# Patient Record
Sex: Female | Born: 1960 | Race: Black or African American | Hispanic: No | Marital: Married | Smoking: Never smoker
Health system: Southern US, Community
[De-identification: ages and names within clinical notes are randomized; demographics above are authoritative.]

## PROBLEM LIST (undated history)

## (undated) DIAGNOSIS — E1149 Type 2 diabetes mellitus with other diabetic neurological complication: Secondary | ICD-10-CM

## (undated) DIAGNOSIS — I10 Essential (primary) hypertension: Secondary | ICD-10-CM

## (undated) DIAGNOSIS — K219 Gastro-esophageal reflux disease without esophagitis: Secondary | ICD-10-CM

## (undated) DIAGNOSIS — M1990 Unspecified osteoarthritis, unspecified site: Secondary | ICD-10-CM

## (undated) DIAGNOSIS — R079 Chest pain, unspecified: Secondary | ICD-10-CM

## (undated) DIAGNOSIS — F32A Depression, unspecified: Secondary | ICD-10-CM

## (undated) DIAGNOSIS — E785 Hyperlipidemia, unspecified: Secondary | ICD-10-CM

## (undated) DIAGNOSIS — F329 Major depressive disorder, single episode, unspecified: Secondary | ICD-10-CM

## (undated) DIAGNOSIS — E119 Type 2 diabetes mellitus without complications: Secondary | ICD-10-CM

## (undated) HISTORY — DX: Unspecified osteoarthritis, unspecified site: M19.90

## (undated) HISTORY — DX: Type 2 diabetes mellitus with other diabetic neurological complication: E11.49

## (undated) HISTORY — PX: SALPINGOOPHORECTOMY: SHX82

## (undated) HISTORY — DX: Depression, unspecified: F32.A

## (undated) HISTORY — PX: ABDOMINAL HYSTERECTOMY: SHX81

## (undated) HISTORY — DX: Major depressive disorder, single episode, unspecified: F32.9

## (undated) HISTORY — DX: Gastro-esophageal reflux disease without esophagitis: K21.9

## (undated) HISTORY — DX: Hyperlipidemia, unspecified: E78.5

## (undated) HISTORY — DX: Chest pain, unspecified: R07.9

## (undated) HISTORY — DX: Essential (primary) hypertension: I10

## (undated) HISTORY — DX: Type 2 diabetes mellitus without complications: E11.9

## (undated) HISTORY — PX: CHOLECYSTECTOMY: SHX55

---

## 1998-11-22 ENCOUNTER — Other Ambulatory Visit: Admission: RE | Admit: 1998-11-22 | Discharge: 1998-11-22 | Payer: Self-pay | Admitting: Obstetrics & Gynecology

## 2000-08-21 ENCOUNTER — Encounter (INDEPENDENT_AMBULATORY_CARE_PROVIDER_SITE_OTHER): Payer: Self-pay | Admitting: Specialist

## 2000-08-21 ENCOUNTER — Encounter (HOSPITAL_BASED_OUTPATIENT_CLINIC_OR_DEPARTMENT_OTHER): Payer: Self-pay | Admitting: General Surgery

## 2000-08-21 ENCOUNTER — Encounter: Payer: Self-pay | Admitting: Internal Medicine

## 2000-08-21 ENCOUNTER — Inpatient Hospital Stay (HOSPITAL_COMMUNITY): Admission: EM | Admit: 2000-08-21 | Discharge: 2000-08-22 | Payer: Self-pay | Admitting: Internal Medicine

## 2000-11-25 ENCOUNTER — Other Ambulatory Visit: Admission: RE | Admit: 2000-11-25 | Discharge: 2000-11-25 | Payer: Self-pay | Admitting: Obstetrics and Gynecology

## 2004-05-15 ENCOUNTER — Emergency Department (HOSPITAL_COMMUNITY): Admission: EM | Admit: 2004-05-15 | Discharge: 2004-05-15 | Payer: Self-pay | Admitting: Emergency Medicine

## 2004-08-29 ENCOUNTER — Emergency Department (HOSPITAL_COMMUNITY): Admission: EM | Admit: 2004-08-29 | Discharge: 2004-08-29 | Payer: Self-pay | Admitting: Emergency Medicine

## 2004-09-05 ENCOUNTER — Ambulatory Visit (HOSPITAL_COMMUNITY): Admission: RE | Admit: 2004-09-05 | Discharge: 2004-09-05 | Payer: Self-pay | Admitting: Obstetrics and Gynecology

## 2004-09-18 ENCOUNTER — Ambulatory Visit: Admission: RE | Admit: 2004-09-18 | Discharge: 2004-09-18 | Payer: Self-pay | Admitting: Gynecologic Oncology

## 2004-09-29 ENCOUNTER — Emergency Department (HOSPITAL_COMMUNITY): Admission: EM | Admit: 2004-09-29 | Discharge: 2004-09-29 | Payer: Self-pay | Admitting: Emergency Medicine

## 2004-10-09 ENCOUNTER — Inpatient Hospital Stay (HOSPITAL_COMMUNITY): Admission: AD | Admit: 2004-10-09 | Discharge: 2004-10-13 | Payer: Self-pay | Admitting: Obstetrics and Gynecology

## 2004-10-09 ENCOUNTER — Encounter: Payer: Self-pay | Admitting: Emergency Medicine

## 2004-10-11 ENCOUNTER — Encounter (INDEPENDENT_AMBULATORY_CARE_PROVIDER_SITE_OTHER): Payer: Self-pay | Admitting: Specialist

## 2005-05-28 ENCOUNTER — Ambulatory Visit: Payer: Self-pay | Admitting: Internal Medicine

## 2005-06-07 ENCOUNTER — Ambulatory Visit: Payer: Self-pay | Admitting: Internal Medicine

## 2005-11-06 ENCOUNTER — Other Ambulatory Visit: Admission: RE | Admit: 2005-11-06 | Discharge: 2005-11-06 | Payer: Self-pay | Admitting: Obstetrics and Gynecology

## 2006-12-22 ENCOUNTER — Ambulatory Visit: Payer: Self-pay | Admitting: Internal Medicine

## 2007-01-16 ENCOUNTER — Ambulatory Visit: Payer: Self-pay | Admitting: Internal Medicine

## 2007-01-16 ENCOUNTER — Observation Stay (HOSPITAL_COMMUNITY): Admission: EM | Admit: 2007-01-16 | Discharge: 2007-01-17 | Payer: Self-pay | Admitting: Emergency Medicine

## 2007-01-17 ENCOUNTER — Ambulatory Visit: Payer: Self-pay | Admitting: Internal Medicine

## 2007-01-28 ENCOUNTER — Ambulatory Visit: Payer: Self-pay | Admitting: Internal Medicine

## 2007-03-09 ENCOUNTER — Ambulatory Visit: Payer: Self-pay | Admitting: Internal Medicine

## 2007-03-09 LAB — CONVERTED CEMR LAB
ALT: 17 units/L (ref 0–40)
AST: 12 units/L (ref 0–37)
BUN: 13 mg/dL (ref 6–23)
CO2: 29 meq/L (ref 19–32)
Calcium: 9.2 mg/dL (ref 8.4–10.5)
Chloride: 109 meq/L (ref 96–112)
Cholesterol: 157 mg/dL (ref 0–200)
Creatinine, Ser: 0.6 mg/dL (ref 0.4–1.2)
GFR calc Af Amer: 139 mL/min
GFR calc non Af Amer: 115 mL/min
Glucose, Bld: 111 mg/dL — ABNORMAL HIGH (ref 70–99)
HDL: 40.1 mg/dL (ref >39.0)
LDL Cholesterol: 93 mg/dL (ref 0–99)
Potassium: 3.8 meq/L (ref 3.5–5.1)
Sodium: 143 meq/L (ref 135–145)
Total CHOL/HDL Ratio: 3.9
Triglycerides: 118 mg/dL (ref 0–149)
VLDL: 24 mg/dL (ref 0–40)

## 2007-03-17 ENCOUNTER — Ambulatory Visit: Payer: Self-pay | Admitting: Internal Medicine

## 2007-06-09 ENCOUNTER — Ambulatory Visit: Payer: Self-pay | Admitting: Internal Medicine

## 2007-06-09 LAB — CONVERTED CEMR LAB
ALT: 20 units/L (ref 0–40)
AST: 15 units/L (ref 0–37)
Albumin: 3.5 g/dL (ref 3.5–5.2)
Alkaline Phosphatase: 77 units/L (ref 39–117)
BUN: 11 mg/dL (ref 6–23)
Basophils Absolute: 0 10*3/uL (ref 0.0–0.1)
Basophils Relative: 0 % (ref 0.0–1.0)
Bilirubin Urine: NEGATIVE
Bilirubin, Direct: 0.1 mg/dL (ref 0.0–0.3)
CO2: 29 meq/L (ref 19–32)
Calcium: 9.9 mg/dL (ref 8.4–10.5)
Chloride: 105 meq/L (ref 96–112)
Cholesterol: 196 mg/dL (ref 0–200)
Creatinine, Ser: 0.8 mg/dL (ref 0.4–1.2)
Eosinophils Absolute: 0.2 10*3/uL (ref 0.0–0.6)
Eosinophils Relative: 2.5 % (ref 0.0–5.0)
GFR calc Af Amer: 100 mL/min
GFR calc non Af Amer: 82 mL/min
Glucose, Bld: 113 mg/dL — ABNORMAL HIGH (ref 70–99)
HCT: 36 % (ref 36.0–46.0)
HDL: 50.6 mg/dL (ref >39.0)
Hemoglobin, Urine: NEGATIVE
Hemoglobin: 12 g/dL (ref 12.0–15.0)
Ketones, ur: NEGATIVE mg/dL
LDL Cholesterol: 130 mg/dL — ABNORMAL HIGH (ref 0–99)
Leukocytes, UA: NEGATIVE
Lymphocytes Relative: 30.7 % (ref 12.0–46.0)
MCHC: 33.3 g/dL (ref 30.0–36.0)
MCV: 84.8 fL (ref 78.0–100.0)
Monocytes Absolute: 0.5 10*3/uL (ref 0.2–0.7)
Monocytes Relative: 7.4 % (ref 3.0–11.0)
Neutro Abs: 4.1 10*3/uL (ref 1.4–7.7)
Neutrophils Relative %: 59.4 % (ref 43.0–77.0)
Nitrite: NEGATIVE
Platelets: 336 10*3/uL (ref 150–400)
Potassium: 4.4 meq/L (ref 3.5–5.1)
RBC: 4.25 M/uL (ref 3.87–5.11)
RDW: 14.3 % (ref 11.5–14.6)
Sodium: 143 meq/L (ref 135–145)
Specific Gravity, Urine: 1.025 (ref 1.000–1.03)
TSH: 1.71 microintl units/mL (ref 0.35–5.50)
Total Bilirubin: 0.5 mg/dL (ref 0.3–1.2)
Total CHOL/HDL Ratio: 3.9
Total Protein, Urine: NEGATIVE mg/dL
Total Protein: 7 g/dL (ref 6.0–8.3)
Triglycerides: 75 mg/dL (ref 0–149)
Urine Glucose: NEGATIVE mg/dL
Urobilinogen, UA: 0.2 (ref 0.0–1.0)
VLDL: 15 mg/dL (ref 0–40)
WBC: 6.9 10*3/uL (ref 4.5–10.5)
pH: 6 (ref 5.0–8.0)

## 2007-06-16 ENCOUNTER — Ambulatory Visit: Payer: Self-pay | Admitting: Internal Medicine

## 2007-10-26 ENCOUNTER — Encounter: Payer: Self-pay | Admitting: Internal Medicine

## 2007-11-27 ENCOUNTER — Encounter: Payer: Self-pay | Admitting: Internal Medicine

## 2008-02-20 ENCOUNTER — Encounter: Payer: Self-pay | Admitting: *Deleted

## 2008-02-20 DIAGNOSIS — Z9089 Acquired absence of other organs: Secondary | ICD-10-CM | POA: Insufficient documentation

## 2008-02-20 DIAGNOSIS — E785 Hyperlipidemia, unspecified: Secondary | ICD-10-CM | POA: Insufficient documentation

## 2008-02-20 DIAGNOSIS — I10 Essential (primary) hypertension: Secondary | ICD-10-CM | POA: Insufficient documentation

## 2008-02-20 DIAGNOSIS — E119 Type 2 diabetes mellitus without complications: Secondary | ICD-10-CM | POA: Insufficient documentation

## 2008-03-16 LAB — CONVERTED CEMR LAB

## 2008-03-22 ENCOUNTER — Telehealth: Payer: Self-pay | Admitting: Internal Medicine

## 2008-06-14 ENCOUNTER — Ambulatory Visit: Payer: Self-pay | Admitting: Internal Medicine

## 2008-06-14 LAB — CONVERTED CEMR LAB
ALT: 18 units/L (ref 0–35)
AST: 12 units/L (ref 0–37)
Albumin: 3.5 g/dL (ref 3.5–5.2)
Alkaline Phosphatase: 83 units/L (ref 39–117)
BUN: 15 mg/dL (ref 6–23)
Basophils Absolute: 0.1 10*3/uL (ref 0.0–0.1)
Basophils Relative: 0.7 % (ref 0.0–1.0)
Bilirubin Urine: NEGATIVE
Bilirubin, Direct: 0.1 mg/dL (ref 0.0–0.3)
CO2: 29 meq/L (ref 19–32)
Calcium: 9.3 mg/dL (ref 8.4–10.5)
Chloride: 104 meq/L (ref 96–112)
Cholesterol: 162 mg/dL (ref 0–200)
Creatinine, Ser: 0.7 mg/dL (ref 0.4–1.2)
Eosinophils Absolute: 0.1 10*3/uL (ref 0.0–0.7)
Eosinophils Relative: 1.8 % (ref 0.0–5.0)
GFR calc Af Amer: 116 mL/min
GFR calc non Af Amer: 96 mL/min
Glucose, Bld: 126 mg/dL — ABNORMAL HIGH (ref 70–99)
HCT: 35.8 % — ABNORMAL LOW (ref 36.0–46.0)
HDL: 47.8 mg/dL (ref >39.0)
Hemoglobin, Urine: NEGATIVE
Hemoglobin: 12.2 g/dL (ref 12.0–15.0)
Ketones, ur: NEGATIVE mg/dL
LDL Cholesterol: 97 mg/dL (ref 0–99)
Leukocytes, UA: NEGATIVE
Lymphocytes Relative: 30.5 % (ref 12.0–46.0)
MCHC: 34.1 g/dL (ref 30.0–36.0)
MCV: 85.2 fL (ref 78.0–100.0)
Monocytes Absolute: 0.6 10*3/uL (ref 0.1–1.0)
Monocytes Relative: 8.9 % (ref 3.0–12.0)
Neutro Abs: 4.2 10*3/uL (ref 1.4–7.7)
Neutrophils Relative %: 58.1 % (ref 43.0–77.0)
Nitrite: NEGATIVE
Platelets: 338 10*3/uL (ref 150–400)
Potassium: 4.1 meq/L (ref 3.5–5.1)
RBC: 4.2 M/uL (ref 3.87–5.11)
RDW: 14.1 % (ref 11.5–14.6)
Sodium: 139 meq/L (ref 135–145)
Specific Gravity, Urine: 1.01 (ref 1.000–1.03)
TSH: 2.62 microintl units/mL (ref 0.35–5.50)
Total Bilirubin: 0.6 mg/dL (ref 0.3–1.2)
Total CHOL/HDL Ratio: 3.4
Total Protein, Urine: NEGATIVE mg/dL
Total Protein: 6.8 g/dL (ref 6.0–8.3)
Triglycerides: 88 mg/dL (ref 0–149)
Urine Glucose: NEGATIVE mg/dL
Urobilinogen, UA: 0.2 (ref 0.0–1.0)
VLDL: 18 mg/dL (ref 0–40)
WBC: 7.2 10*3/uL (ref 4.5–10.5)
pH: 7.5 (ref 5.0–8.0)

## 2008-06-20 ENCOUNTER — Ambulatory Visit: Payer: Self-pay | Admitting: Internal Medicine

## 2008-06-20 DIAGNOSIS — E1149 Type 2 diabetes mellitus with other diabetic neurological complication: Secondary | ICD-10-CM

## 2008-06-28 ENCOUNTER — Encounter: Payer: Self-pay | Admitting: Internal Medicine

## 2008-10-20 ENCOUNTER — Ambulatory Visit: Payer: Self-pay | Admitting: Internal Medicine

## 2008-10-20 ENCOUNTER — Telehealth: Payer: Self-pay | Admitting: Internal Medicine

## 2008-10-20 DIAGNOSIS — R079 Chest pain, unspecified: Secondary | ICD-10-CM

## 2008-11-11 ENCOUNTER — Ambulatory Visit: Payer: Self-pay

## 2008-11-11 ENCOUNTER — Encounter: Payer: Self-pay | Admitting: Cardiology

## 2008-11-26 ENCOUNTER — Encounter: Payer: Self-pay | Admitting: Internal Medicine

## 2008-12-01 ENCOUNTER — Telehealth: Payer: Self-pay | Admitting: Internal Medicine

## 2008-12-12 ENCOUNTER — Encounter: Payer: Self-pay | Admitting: Internal Medicine

## 2008-12-29 ENCOUNTER — Telehealth: Payer: Self-pay | Admitting: Internal Medicine

## 2009-01-02 ENCOUNTER — Telehealth: Payer: Self-pay | Admitting: Internal Medicine

## 2009-01-02 ENCOUNTER — Ambulatory Visit: Payer: Self-pay | Admitting: Internal Medicine

## 2009-03-07 ENCOUNTER — Telehealth: Payer: Self-pay | Admitting: Internal Medicine

## 2009-03-07 ENCOUNTER — Ambulatory Visit: Payer: Self-pay | Admitting: Internal Medicine

## 2009-03-17 ENCOUNTER — Encounter: Payer: Self-pay | Admitting: Internal Medicine

## 2009-09-04 ENCOUNTER — Telehealth: Payer: Self-pay | Admitting: Internal Medicine

## 2009-10-20 ENCOUNTER — Ambulatory Visit: Payer: Self-pay | Admitting: Internal Medicine

## 2009-10-20 LAB — CONVERTED CEMR LAB
ALT: 16 units/L (ref 0–35)
AST: 15 units/L (ref 0–37)
Albumin: 3.7 g/dL (ref 3.5–5.2)
Alkaline Phosphatase: 102 units/L (ref 39–117)
BUN: 11 mg/dL (ref 6–23)
Basophils Absolute: 0 10*3/uL (ref 0.0–0.1)
Basophils Relative: 0.1 % (ref 0.0–3.0)
Bilirubin Urine: NEGATIVE
Bilirubin, Direct: 0.1 mg/dL (ref 0.0–0.3)
CO2: 29 meq/L (ref 19–32)
Calcium: 9.5 mg/dL (ref 8.4–10.5)
Chloride: 105 meq/L (ref 96–112)
Cholesterol: 145 mg/dL (ref 0–200)
Creatinine, Ser: 0.7 mg/dL (ref 0.4–1.2)
Eosinophils Absolute: 0.2 10*3/uL (ref 0.0–0.7)
Eosinophils Relative: 2.7 % (ref 0.0–5.0)
GFR calc non Af Amer: 114.73 mL/min (ref >60)
Glucose, Bld: 100 mg/dL — ABNORMAL HIGH (ref 70–99)
HCT: 37.7 % (ref 36.0–46.0)
HDL: 44.6 mg/dL (ref >39.00)
Hemoglobin, Urine: NEGATIVE
Hemoglobin: 12.2 g/dL (ref 12.0–15.0)
Ketones, ur: NEGATIVE mg/dL
LDL Cholesterol: 85 mg/dL (ref 0–99)
Leukocytes, UA: NEGATIVE
Lymphocytes Relative: 36.8 % (ref 12.0–46.0)
Lymphs Abs: 2.4 10*3/uL (ref 0.7–4.0)
MCHC: 32.5 g/dL (ref 30.0–36.0)
MCV: 86.2 fL (ref 78.0–100.0)
Monocytes Absolute: 0.5 10*3/uL (ref 0.1–1.0)
Monocytes Relative: 7.7 % (ref 3.0–12.0)
Neutro Abs: 3.3 10*3/uL (ref 1.4–7.7)
Neutrophils Relative %: 52.7 % (ref 43.0–77.0)
Nitrite: NEGATIVE
Platelets: 309 10*3/uL (ref 150.0–400.0)
Potassium: 4.3 meq/L (ref 3.5–5.1)
RBC: 4.37 M/uL (ref 3.87–5.11)
RDW: 14.2 % (ref 11.5–14.6)
Sodium: 141 meq/L (ref 135–145)
Specific Gravity, Urine: 1.01 (ref 1.000–1.030)
TSH: 1.58 microintl units/mL (ref 0.35–5.50)
Total Bilirubin: 0.5 mg/dL (ref 0.3–1.2)
Total CHOL/HDL Ratio: 3
Total Protein, Urine: NEGATIVE mg/dL
Total Protein: 7.3 g/dL (ref 6.0–8.3)
Triglycerides: 77 mg/dL (ref 0.0–149.0)
Urine Glucose: NEGATIVE mg/dL
Urobilinogen, UA: 0.2 (ref 0.0–1.0)
VLDL: 15.4 mg/dL (ref 0.0–40.0)
WBC: 6.4 10*3/uL (ref 4.5–10.5)
pH: 7 (ref 5.0–8.0)

## 2009-11-01 ENCOUNTER — Telehealth: Payer: Self-pay | Admitting: Internal Medicine

## 2009-12-28 ENCOUNTER — Encounter: Payer: Self-pay | Admitting: Internal Medicine

## 2009-12-28 ENCOUNTER — Telehealth: Payer: Self-pay | Admitting: Internal Medicine

## 2010-01-01 ENCOUNTER — Encounter: Payer: Self-pay | Admitting: Internal Medicine

## 2010-08-29 ENCOUNTER — Telehealth (INDEPENDENT_AMBULATORY_CARE_PROVIDER_SITE_OTHER): Payer: Self-pay | Admitting: *Deleted

## 2010-09-14 ENCOUNTER — Telehealth: Payer: Self-pay | Admitting: Internal Medicine

## 2011-01-15 NOTE — Progress Notes (Signed)
Summary: METFORMIN ?  Phone Note From Pharmacy   Summary of Call: Pharm is req metformin ER 1 two times a day but EMR has pt taking med one once daily. Please advise.  Initial call taken by: Lamar Sprinkles, CMA,  August 29, 2010 7:06 PM  Follow-up for Phone Call        reviewed records: patient was takng metformin ER 500mg  once daily No note or record to show a chnage in dose.  Lst OV No '11. Will need OV Oct or Nov. Will need labs to include A1C 250.00 Follow-up by: Jacques Navy MD,  August 30, 2010 1:00 PM  Additional Follow-up for Phone Call Additional follow up Details #1::        Rx sent in, please help schedule pt for cpx w/labs prior also include A1c 250.00. THANK U Additional Follow-up by: Lamar Sprinkles, CMA,  August 30, 2010 4:48 PM    Additional Follow-up for Phone Call Additional follow up Details #2::    Pt contacted, pt states she will call back asap to make appt as stated above Follow-up by: Verdell Face,  September 20, 2010 12:15 PM  Prescriptions: GLUCOPHAGE XR 500 MG  TB24 (METFORMIN HCL) Take 1 tablet once daily  #90 x 0   Entered by:   Lamar Sprinkles, CMA   Authorized by:   Jacques Navy MD   Signed by:   Lamar Sprinkles, CMA on 08/30/2010   Method used:   Electronically to        [facility] [address] (retail)       [phone]       [place], [place]  [address]       Ph: [phone]       Fax: [phone]   RxID:   [id number]

## 2011-01-15 NOTE — Progress Notes (Signed)
Summary: Prilosec/Omeprazole pa  Phone Note Call from Patient   Call For: [facility] [phone] Summary of Call: PA request--Prilosec 20mg . They will fax form. Initial call taken by: Lucious Groves,  December 28, 2009 9:37 AM  Follow-up for Phone Call        Form rec'd and awaiting signature--on counter beside MD printer. Follow-up by: Lucious Groves,  December 28, 2009 9:59 AM  Additional Follow-up for Phone Call Additional follow up Details #1::        form was signed by MD and faxed. Additional Follow-up by: Lucious Groves,  December 28, 2009 10:43 AM    Additional Follow-up for Phone Call Additional follow up Details #2::    I have not rec'd an approval/denial. Called [facility], and prescription has been approved until 12/28/2010. Follow-up by: Lucious Groves,  January 01, 2010 4:14 PM

## 2011-01-15 NOTE — Medication Information (Signed)
Summary: P Auth Prilosec/[facility]  P Auth Prilosec/[facility]   Imported By: Lester [place] 01/03/2010 08:15:07  _____________________________________________________________________  External Attachment:    Type:   Image     Comment:   External Document

## 2011-01-15 NOTE — Medication Information (Signed)
Summary: Elissa Hefty   Imported By: Sherian Rein 01/01/2010 13:48:35  _____________________________________________________________________  External Attachment:    Type:   Image     Comment:   External Document

## 2011-01-15 NOTE — Progress Notes (Signed)
  Phone Note Refill Request Message from:  Fax from Pharmacy on September 14, 2010 2:17 PM  Refills Requested: Medication #1:  GABAPENTIN 300 MG  CAPS 1 by mouth three times a day for leg pain fax from pharm. Please Advise refill  Initial call taken by: Ami Bullins CMA,  September 14, 2010 2:17 PM  Follow-up for Phone Call        ok for refill x 5 Follow-up by: Jacques Navy MD,  September 14, 2010 4:00 PM    Prescriptions: GABAPENTIN 300 MG  CAPS (GABAPENTIN) 1 by mouth three times a day for leg pain  #90 Each x 5   Entered by:   Bill Salinas CMA   Authorized by:   Jacques Navy MD   Signed by:   Bill Salinas CMA on 09/14/2010   Method used:   Electronically to        [facility] [address] (retail)       [phone]       [place], [place]  [address]       Ph: [phone]       Fax: [phone]   RxID:   [id number]

## 2011-04-30 NOTE — Assessment & Plan Note (Signed)
[facility]                           PRIMARY CARE OFFICE NOTE   NAME:PASCHALMisty, Washington                       MRN:          [id number]  DATE:06/16/2007                            DOB:          02/19/1961    [name] Washington is a 50 year old African-American woman who presents for  followup evaluation and exam.  She was last seen in the office March 17, 2007 for diabetes, hyperlipidemia.  In the interval the patient reports  she is having problem with dysesthesia in an area of her left thigh on  the lateral aspect.  This is a site of varicose veins.  The patient  reports this is a long standing problem but it is much worse now and  exacerbated by walking.   The patient reports that her diabetes has been well controlled with CBGs  in the 75 to 120 range.  The patient reports she has been monitoring her  blood pressure at home and it has generally been well controlled.   PAST MEDICAL HISTORY, FAMILY HISTORY, SOCIAL HISTORY:  Well documented  in my note January 16, 2007, as is family history and social history.  This was a note by Dr. Thomos Lemons.   CURRENT MEDICATIONS:  1. Quinaretic 20/25 one daily.  2. Aspirin 325 mg daily.  3. Calcium.  4. Metformin 500 mg b.i.d.  5. Pravastatin 40 mg daily.   REVIEW OF SYSTEMS:  The patient has had no fevers, sweats or chills or  other constitutional problems.  She has an eye exam scheduled for August  2008.  No ENT, cardiovascular, respiratory, GI or GU problems.   SOCIAL HISTORY:  Is unchanged.   EXAMINATION:  Temperature was 97.7, blood pressure 144/89 and pulse 75.  Weight 218.  GENERAL APPEARANCE:  This is an obese African-American woman in no acute  distress.  HEENT:  Normocephalic.  Atraumatic.  EACs and TMs were unremarkable.  Oropharynx with native dentition in good repair.  No buccal or palate  lesions were noted.  Posterior pharynx was clear.  Conjunctivae and  sclerae were clear.  Pupils equal, round  and reactive to light and  accommodation.  Funduscopic exam was unremarkable.  NECK:  Supple without thyromegaly.  NODES:  No adenopathy was noted in the cervical, supraclavicular  regions.  CHEST:  No CVA tenderness.  LUNGS:  Clear to auscultation and percussion.  CARDIOVASCULAR:  2+ radial pulse, no JVD, no carotid bruits.  She had a  quiet precordium with a regular rate and rhythm, without murmurs, rubs  or gallops.  BREAST:  Exam deferred to gynecology.  ABDOMEN:  Obese, no guarding or rebound.  PELVIC AND RECTAL:  Exam was deferred to gynecology.  EXTREMITIES:  Without clubbing, cyanosis, edema, no deformities were  noted.   LABORATORY:  Hemoglobin 12 g, white count was 6,900 with a normal  differential,  chemistries with a serum glucose of 113.  Liver functions  were normal.  Kidney function normal with a creatinine of 0.8 and GFR of  100 milliliters per minute.  Cholesterol is 196, triglycerides 75, HDL  is 50.6,  LDL is 130.  Thyroid function normal with a TSH of 1.71,  urinalysis was cloudy but negative otherwise.   ASSESSMENT AND PLAN:  1. Diabetes.  The patient's last hemoglobin A1C was 7.3% in February      2008.  Her serum glucose is now 113.  She reports her CBGs are      evidently well controlled.  Plan is to continue present medication.  2. Hypertension.  Well controlled.  3. Lipids.  The patient's previous LDL cholesterol was 93, now up to      130, which I suspect is dietary in nature.  In addition she is on      pravastatin which is on the low end of efficacy.  Plan:  The      patient is to switch to simvastatin 40 mg daily. She is to be more      careful with her diet in regards to fat.  Regular exercise would be      helpful.  4. Paresthesia.  The patient has area of paresthesia in her left      thigh, which I suspect is neuropathic.  I doubt it is related to      the varicosity.  At this point patient is given a trial of Lyrica      50 mg daily for 3 days, 50  mg b.i.d. for 3 days, then 50 mg t.i.d.      The patient will report back to me as to the efficacy of the      Lyrica.  5. Health maintenance.  The patient is followed by Dr. Su Hilt of GYN.      She is status post hysterectomy and unilateral oophorectomy.  I      have recommended that she see the gynecologist for routine      evaluation and exam, including breast exam.   SUMMARY:  This is a pleasant woman with medical problems as outlined  above.  She will endeavor to be more adherent to a low fat diet and we  will try to find an exercise method she can tolerate.     [name] Gess Norins, MD  Electronically Signed    MEN/MedQ  DD: 06/17/2007  DT: 06/17/2007  Job #: [id number]   cc:   [name] Washington

## 2011-05-03 NOTE — H&P (Signed)
[name] Washington, SKILLIN                ACCOUNT NO.:  [id number]   MEDICAL RECORD NO.:  [id number]          PATIENT TYPE:  INP   LOCATION:  9319                          FACILITY:  [facility]   PHYSICIAN:  Hal Morales, M.D.DATE OF BIRTH:  10-17-1961   DATE OF ADMISSION:  10/09/2004  DATE OF DISCHARGE:                                HISTORY & PHYSICAL   HISTORY OF PRESENT ILLNESS:  The patient is a 50 year old, para 0-1-0-1 who  presents for evaluation of abdominal pelvic pain and a large pelvic mass.  The history of the present illness began approximately six weeks ago when  the patient had her September menses.  Subsequent to her menses, she  experienced significant abdominal and pelvic pain and presented to the  [facility] Emergency Room. She underwent an abdominopelvic CT scan as part  of her emergency room workup on August 29, 2004 showing the abdomen to be  status post cholecystectomy with no active disease in the abdomen and the  pelvis to contain a large cystic mass associated with the right ovary  measuring approximately 9.2 x 7.2 cm.  There was likewise a smaller cystic  mass associated with the right ovary with some calcification along one of  the margins. She also underwent GC and chlamydia cultures which were  negative. A urinalysis which was negative and RPR which was negative.  She  was seen the following day and underwent an abdominopelvic ultrasound which  showed the complex right adnexal cystic mass measuring approximately 10 cm.  It should be noted that on the CT scan no abnormal lymph nodes were seen in  the pelvis at all.  Subsequent to her evaluation and ultrasound, she was  seen by De Blanch, M.D. on September 18, 2004 with his evaluation  including a CA 125 of 29.5.  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 She was likewise noted to have fibroids which  were  relatively asymptomatic. At the time, the patient was no longer having  significant pain and opted to delay the recommended exploratory laparotomy  until she was on holiday from school.  The patient was thus scheduled for  exploratory laparotomy on October 18, 2004.   The patient presented to the [facility] Emergency Department on the evening  of October 08, 2004 complaining of worsening of her pain. She states that  she has had pain off and on since her last menstrual period which began on  October 26, 2004.  She has been treated with oral pain medication but has  really gotten minimal relief.  The pain has been persistent and exacerbated  to the point that on October 24, she presented to the emergency department  where she underwent an acute abdomen and chest series showing a normal bowel  gas pattern and no active cardiovascular disease.  The patient presented to  our office on October 09, 2004 complaining that she had gotten no relief  from her pain after her visit to the emergency department rating  her pain as  an 8-10/10.  She also complained of nausea with minimal vomiting and  constipation.  She also states that she has had some difficulty with voiding  and that she voids only small amounts.   PAST MEDICAL HISTORY:  1.  Hypertension.  2.  Exogenous obesity.   PAST SURGICAL HISTORY:  1.  Cholecystectomy.  2.  Tubal ligation.   OBSTETRICAL HISTORY:  Spontaneous vaginal delivery of a preterm infant in  58.   CURRENT MEDICATIONS:  1.  Accuretic 25/20, 1 p.o. q.d.  2.  Calcium.  3.  Multivitamins.   FAMILY HISTORY:  No history of ovarian, breast or colon cancer.   SOCIAL HISTORY:  The patient is married and works as a Surveyor, mining.  She does not smoke or drink alcohol.   REVIEW OF SYMPTOMS:  Positive for report of fever at home but temperature  was not actually taken and the patient does not remember when her last fever  was.  She also complains of the  above nausea with minimal vomiting and has  had no vomiting during her observation in the emergency room.  The remainder  of the review of systems is negative except as mentioned above.   PHYSICAL EXAMINATION:  GENERAL:  The patient is an obese black female in  moderate distress from abdominopelvic pain.  VITAL SIGNS:  The temperature is 93, pulse 90, respirations 20, blood  pressure 140/80.  LUNGS:  Clear.  HEART:  Regular rate and rhythm.  ABDOMEN:  Obese which limits the abdominal examination.  There is tenderness  to deep palpation especially in the right lower quadrant.  There is no  rebound tenderness.  PELVIC:  Performed by Osborn Coho, M.D. in our office and revealed  external genitalia, Bartholin's, urethra and Skene's normal. Vagina normal.  The cervix was without any lesions and there was no cervical motion  tenderness. On bimanual examination, the exam was compromised by the  patient's body habitus and pain but no organs could be palpated. There was  noted to be no stool in the rectum on rectovaginal examination.   LABORATORY DATA:  CBC and chemistries are pending. The ultrasound by verbal  report shows a 10 cm cyst with a question of bleeding into the cyst. There  was flow noted through the pedicle. There was some free fluid in the  peritoneal cavity with the differential being hemorrhage into the neoplastic  cyst and possible torsion though the flow through the pedicle seemed to  mitigate against this.   IMPRESSION:  1.  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  2.  Insidious onset abdominopelvic pain probably secondary to hemorrhage      into the cyst.  3.  Uterine fibroids relatively asymptomatic.  4.  Exogenous obesity.  5.  Chronic hypertension.   DISPOSITION:  The patient is admitted for pain control and further observation as she is prepared for  surgery. The goal will be to perform a  bowel prep to allow the patient to be maximally prepared for her surgical  procedure.  This will be begun this evening. She will likewise be hydrated.  She will be reassessed in the morning after having been n.p.o. overnight to  determine whether she needs to undergo urgent laparotomy on October 26  otherwise her surgery is scheduled for October 11, 2004.  This plan has been  discussed with the patient and her husband and they concur.     Colin Ina   VPH/MEDQ  D:  10/09/2004  T:  10/09/2004  Job:  [id number]

## 2011-05-03 NOTE — Discharge Summary (Signed)
NAMESHAWNA, WEARING                ACCOUNT NO.:  [id number]   MEDICAL RECORD NO.:  [id number]          PATIENT TYPE:  INP   LOCATION:  9319                          FACILITY:  [facility]   PHYSICIAN:  Osborn Coho, M.D.   DATE OF BIRTH:  [birth year]   DATE OF ADMISSION:  10/09/2004  DATE OF DISCHARGE:  10/13/2004                                 DISCHARGE SUMMARY   ADDENDUM   ADMISSION PHYSICAL EXAMINATION:  VITAL SIGNS:  Blood pressure ranging from  132 to 140 over 82 to 94, pulse was 90, respirations 20, temperature 98.3  degrees Fahrenheit orally.  GENERAL:  Within normal limits.  However, her abdominal exam revealed  distention.  (During the patient's preoperative evaluation she was found to  have tenderness in the right lower quadrant to deep palpation; however,  there was no rebound tenderness.)  PELVIC:  (Taken from preoperative evaluation) EG/BUS was within normal  limits.  The vagina was normal.  Cervix was without any lesions and there  was no cervical motion tenderness.  On bimanual exam, the exam was  compromised by the patient's body habitus as well as her pain, but no organs  could be palpated.  It was also noted that there was no stool in the rectum  on rectovaginal examination.     Elmi   EJP/MEDQ  D:  10/31/2004  T:  10/31/2004  Job:  [id number]

## 2011-05-03 NOTE — Op Note (Signed)
[facility]  Patient:    [name] Washington, [name] Washington                         MRN: [id number] Proc. Date: 08/21/00 Adm. Date:  [id number] Attending:  Fortino Sic CC:         Titus Dubin. Alwyn Ren, M.D. [facility]   Operative Report  PREOPERATIVE DIAGNOSIS:  Symptomatic cholelithiasis.  POSTOPERATIVE DIAGNOSIS:  Symptomatic cholelithiasis.  OPERATION PERFORMED:  Laparoscopic cholecystectomy with intraoperative cholangiogram.  SURGEON:  Marnee Spring. Wiliam Ke, M.D.  ASSISTANT:  Mardene Celeste. Lurene Shadow, M.D.  ANESTHESIA:  Endotracheal by hospital.  DESCRIPTION OF PROCEDURE:  Under good endotracheal anesthesia, the skin of the abdomen was prepped and draped in the usual manner.  A transverse incision was made above the umbilicus.  The abdomen was entered.  A pursestring suture was placed.  The Hassan cannula was inserted and pneumoperitoneum was obtained. The peritoneal surfaces appeared normal save for the gallbladder which was clearly inflamed and swollen with many adhesions.  A 10 mm and two 5 mm cannulas were inserted in the usual fashion.  The adhesions were taken down. Dissecting near the porta hepatis, the cystic artery was identified, triply clipped, and cut with ________.  The cystic duct was identified and it was clipped on the gallbladder side.  The cystic duct was opened and a Reddick catheter was inserted.  Cholangiogram was performed using the fluoroscopic technique.  This showed no dilatation, but did show no flow into the duodenum. Glucagon was given intravenously and there is still no flow into the duodenum.  At this point, the cystic duct was triply clipped and cut using two clips proximally.  The gallbladder was removed from the liver bed with electrocautery current and hemostasis was obtained with electrocautery current.  The gallbladder was removed via the umbilical port in an Endopouch. The gallbladder was full of hundreds of thousands of stones and it was necessary  to enlarge the umbilical wound somewhat.  The right upper quadrant was copiously irrigated with saline and sucked dry.  The umbilical wound was closed with figure-of-eight suture of 0 Vicryl.  The wound was copiously irrigated with saline and then all the cannulas removed.  The wounds were closed with subcuticular 4-0 Vicryl and Steri-Strips applied.  Estimated blood loss minimal.  The patient received no blood and left the operating room in satisfactory condition after sponge and needle counts were verified. DD:  08/21/00 TD:  08/22/00 Job: [id number]

## 2011-05-03 NOTE — Consult Note (Signed)
[facility]  Patient:    [name] Washington, [name] Washington                         MRN: [id number] Proc. Date: 08/21/00 Adm. Date:  [id number] Attending:  Fortino Sic CC:         Marnee Spring. Wiliam Ke, M.D.             Titus Dubin. Alwyn Ren, M.D. [facility]                          Consultation Report  REFERRED BY:  Dr. Marnee Spring. Wiliam Ke, M.D.  REASON FOR CONSULTATION:  Possible common duct stone.  HISTORY:  45 black female who presented to the emergency room with severe back and right upper quadrant pain, nausea and vomiting.  Ultrasound showed multiple gallstones; no dilated ducts.  Patients labs were remarkable for a completely normal liver test, total bilirubin and white count was normal.  She had a laparoscopic cholecystectomy by Dr. Wiliam Ke today; gallbladder was removed.  The common duct was filled and did not show any clear-cut stones but it appeared that there may be a stone on one picture, the second picture showed this to fill out, but the contrast did not come into the duodenum.  Due to the lack of drainage, we were asked to see her to see whether or not we felt an ERCP was necessary.  MEDICATIONS ON ADMISSION 1. Diltia XT 180 mg q.d. 2. Tylenol ES.  ALLERGIES:  She is allergic to PENICILLIN.  MEDICAL HISTORY:  Hypertension.  No other chronic medical problems.  PAST SURGICAL HISTORY:  Only surgery is a previous tubal ligation.  FAMILY HISTORY:  Noncontributory.  PHYSICAL EXAMINATION  VITAL SIGNS:  Patient is afebrile.  Vital signs are normal.  GENERAL:  Somewhat groggy black female who is alert.  She is still somewhat sleepy after her anesthesia.  HEENT:  Eyes:  Sclerae nonicteric.  LUNGS:  Clear.  HEART:  Regular rate and rhythm, without murmurs or gallops.  ABDOMEN:  Soft and generally nontender other than normal postoperative tenderness.  There are no bowel sounds present.  ASSESSMENT:  Delayed emptying of common bile duct -- there is no clear stone in  the duct.  I will not subject this woman to an endoscopic retrograde cholangiopancreatogram since I would question whether this is due to anesthesia versus common duct stone.  PLAN:  Will check LFTs in the a.m. to make the decision at that time on whether or not to proceed with an ERCP.  DD:  08/21/00 TD:  08/21/00 Job: [id number]

## 2011-05-03 NOTE — Assessment & Plan Note (Signed)
[facility]                           PRIMARY CARE OFFICE NOTE   NAME:[name], [name] Washington                       MRN:          [id number]  DATE:01/16/2007                            DOB:          [birth year]    CHIEF COMPLAINT:  Chest pain.   HISTORY OF PRESENT ILLNESS:   HISTORY OF PRESENT ILLNESS:  The patient is a 50 year old African-  American female with past medical history of hypertension, who presents  with 24-48 hours of chest discomfort.  She states last night she had a  drawing sensation.  She points to the center of her chest, that occurred  nonexertional.  She was sleeping.  She notes discomfort that waxes and  wanes.  She does not characterize it as burning.  She has had increased  intensity of  symptoms over the last 3 week time period.  In addition,  she has not been able to afford her blood pressure medications for at  least 1-2 weeks due to cost.   She denies any associated shortness of breath or diaphoresis. Chest pain  unrelieved with Zantac or over-the-counter antacids.   PAST MEDICAL HISTORY/SUMMARY:  1. Hypertension with poor compliance.  2. History of abnormal glucose 1 year ago, never following up her      blood testing.  3. Possible dyslipidemia.  4. Morbid obesity.  5. Status post laparoscopic cholecystectomy in 2001.  6. Status post abdominal hysterectomy with right salpingo-oophorectomy      in 2005.   CURRENT MEDICATIONS:  1. Quinaretic 20/25, 1 daily.  2. Aspirin 325 mg once daily.  3. Multivitamin 1 daily.   ALLERGIES TO MEDICATIONS:  PENICILLIN WHICH CAUSES SWELLING.   HABITS:  No tobacco, occasional alcohol.   FAMILY HISTORY:  Father had heart disease was status post bypass  surgery.  Both parents had hypertension.   SOCIAL HISTORY:  The patient is married, accompanied by her husband.  She is a Engineer, agricultural, normally drives a school bus.  She does  have a 33-year-old son.   REVIEW OF SYSTEMS:  As  noted above.  No HEENT symptoms.  The patient  denies any lower extremity swelling, frequent heartburn, no dysuria.  All other systems negative.   EKG was performed in the office which showed normal sinus rhythm at 66  beats per minute.  She had T-wave abnormality in the inferolateral  leads.  Compared to previous EKG, she does have a small Q wave in II,  III, and aVF that was not apparent on EKG in July 2001.   PHYSICAL EXAMINATION:  VITAL SIGNS:  Height is not obtained.  Weight is  235 pounds.  Temperature is 98.  Pulse is 73.  BP is 174/95 in the left  arm in seated position.  GENERAL:  The patient is a pleasant morbidly obese African-American  female in no apparent distress.  HEENT:  Normocephalic, atraumatic.  Pupils are equal and reactive to  light bilaterally.  Extraocular motility was intact.  The patient was  anicteric.  Conjunctivae was within normal limits. TM's clear  bilaterally.  Oropharynx was unremarkable.  NECK:  Thick but no adenopathy or thyromegaly.  The patient did have  increased pigmentation and thickening of the skin posterior over neck,  possibly acanthosis nigricans.  CHEST:  Normal respiratory effort.  Chest was clear to auscultation  bilaterally.  No rhonchi, rales, or wheezing.  CARDIOVASCULAR:  Regular rate, rhythm.  No significant murmurs, rubs, or  gallops appreciated.  No palpable chest wall tenderness.  ABDOMEN:  Protuberant/obese, had mild epigastric discomfort.  No  organomegaly.  MUSCULOSKELETAL:  No cyanosis, clubbing, or edema.  SKIN:  Warm and dry.  NEUROLOGIC:  Cranial nerves 2-12 grossly intact.  She was nonfocal.   IMPRESSION:  1. Atypical chest pain in a 50 -year-old African-American female with      multiple risk factors and abnormal EKG.  2. Hypertension, uncontrolled.  3. History of abnormal glucose, probable type 2 diabetes.  4. Gastroesophageal reflux disease   RECOMMENDATIONS:  Despite atypical symptomatology, the patient does  have  abnormal EKG, and we will admit for a cardiac rule out.  We will cycle  cardiac enzymes, check a D-dimer and chest x-ray.  I will restart her  Quinaretic, and we discussed the importance of taking her blood pressure  medications on a regular basis.  In addition, we will start Lopressor at  low dose 25 mg 1/2 p.o. b.i.d.  Her aspirin will be continued.   If she rules out with negative cardiac enzymes, we discussed obtaining  stress test as an outpatient.  She was started on Protonix at 40 mg p.o.  b.i.d.     Barbette Hair. Artist Pais, DO  Electronically Signed   RDY/MedQ  DD: 01/16/2007  DT: 01/16/2007  Job #: [id number]   cc:   Rosalyn Gess. Norins, MD

## 2011-05-03 NOTE — H&P (Signed)
[facility]  Patient:    [name] Washington, [name] Washington                         MRN: [id number] Adm. Date:  [id number] Attending:  Fortino Sic CC:         Titus Dubin. Alwyn Ren, M.D. [facility]   History and Physical  CHIEF COMPLAINT:  Abdominal pain.  HISTORY OF PRESENT ILLNESS:  This is a 50 year old, gravida 1, para 1, last menstrual period seven days ago, awakened with the very sudden onset of severe upper midline back pain associated with severe nausea and vomiting, epigastric and right upper quadrant pain and tenderness.  She came to the emergency room and received a Toradol shot and is somewhat relieved.  The patient was still somewhat nauseated and still somewhat tender.  There have been no fevers, chills, or sweating spells, no cough, chest pain, or pleurisy.  PAST MEDICAL HISTORY:  She has had hypertension, under treatment.  PAST SURGICAL HISTORY:  Tubal ligation.  SOCIAL HISTORY:  Cigarettes:  None.  Alcohol occasionally.  MEDICATIONS:  High blood pressure pill.  ALLERGIES:  PENICILLIN.  REVIEW OF SYSTEMS:  Otherwise negative.  FAMILY HISTORY:  Father has had triple bypass.  Mother also has heart disease.  PHYSICAL EXAMINATION:  VITAL SIGNS:  Temperature 97.5, pulse 80, respirations 18, blood pressure 160/100.  GENERAL:  Obese, in mild abdominal distress.  HEENT:  Normocephalic.  Eyes, ears, nose, and throat normal.  NECK:  Supple.  CHEST:  Clear.  HEART:  Regular rhythm without thrills, murmurs, or gallops.  ABDOMEN:  Obese, slightly tender right upper quadrant.  PELVIC, RECTAL:  Deferred.  EXTREMITIES:  Within normal limits.  NEUROLOGIC:  Within normal limits.  SKIN:  Within normal limits.  ADMITTING IMPRESSION:  Symptomatic cholelithiasis. DD:  08/21/00 TD:  08/21/00 Job: [id number]

## 2011-05-03 NOTE — Discharge Summary (Signed)
[name] Washington, [name] Washington NO.:  [id number]   MEDICAL RECORD NO.:  [id number]          PATIENT TYPE:  INP   LOCATION:  4733                         FACILITY:  [facility]   PHYSICIAN:  Corwin Levins, MD      DATE OF BIRTH:  05/09/1961   DATE OF ADMISSION:  01/16/2007  DATE OF DISCHARGE:  01/17/2007                               DISCHARGE SUMMARY   DISCHARGE DIAGNOSES:  1. Atypical chest pain.  2. Hypertension.  3. New onset diabetes mellitus.  4. Probable gastroesophageal reflux.  5. Obesity.  6. Noncompliance.  7. Hypercholesterolemia.   PROCEDURES:  None.   CONSULTS:  None.   HISTORY AND PHYSICAL:  See that dictated day of admission per Dr. Artist Pais.   HOSPITAL COURSE:  [name] is a very nice 50 year old black female  who was admitted with atypical features for chest discomfort, which  seemed overnight to respond to Protonix.  There was no discomfort by the  time of the a.m., by the time the morning of the discharge.  There was  some low-grade temperature at 99.2, but no other symptoms such as sore  throat, cough or urinary symptoms.  Laboratory evaluation proved  negative for cardiac enzymes serially.  Chest exam was unchanged.  There  was a new finding of a hemoglobin A1c at 7.3, which consisted with new  onset diabetes.  This was explained to the patient.  CBGs from the  hospital were in the low 100s.  As she was ambulatory, eating well, no  further symptoms and laboratory evaluation negative, telemetry normal,  we felt she had gained maximal benefit from this hospitalization and is  to be discharged home.  Also of note, total cholesterol is 215 with an  LDL of 150.   DISPOSITION:  Discharged to home in good condition.  She is to follow an  1800 calorie ADA diet and unfortunately, we were unable to provide  teaching in the hospital due to the short stay.  She will need to follow  up with Dr. Artist Pais in 1-2 weeks and consideration should be made for  cardiovascular stress testing.  Consideration also should be made for  statin therapy at that time.   DISCHARGE MEDICATIONS:  Will include:  1. Quinaretic 20/25 one p.o. every day.  2. Metformin ER 500 mg one p.o. every day.  3. Protonix 40 mg one p.o. every day.  4. Multivitamin 1 p.o. every day.      Corwin Levins, MD  Electronically Signed     JWJ/MEDQ  D:  01/17/2007  T:  01/18/2007  Job:  [id number]   cc:   Thomos Lemons, MD

## 2011-05-03 NOTE — Op Note (Signed)
NAMEKIRSI, HUGH                ACCOUNT NO.:  [id number]   MEDICAL RECORD NO.:  [id number]          PATIENT TYPE:  INP   LOCATION:  9319                          FACILITY:  [facility]   PHYSICIAN:  Osborn Coho, M.D.   DATE OF BIRTH:  [birth year]   DATE OF PROCEDURE:  10/11/2004  DATE OF DISCHARGE:                                 OPERATIVE REPORT   PREOPERATIVE DIAGNOSES:  1.  Complex right ovarian mass.  2.  Pelvic pain.  3.  Fibroid uterus.   POSTOPERATIVE DIAGNOSES:  1.  Complex right ovarian mass.  2.  Pelvic pain.  3.  Fibroid uterus.  4.  Right ovarian torsion.   PROCEDURE:  Total abdominal hysterectomy and right salpingo-oophorectomy.   ANESTHESIA:  General.   ATTENDING PHYSICIAN:  Osborn Coho, M.D.   ASSISTANT:  Janine Limbo, M.D.   FLUIDS:  2200 mL.   URINE OUTPUT:  800 mL.   ESTIMATED BLOOD LOSS:  500 mL.   COMPLICATIONS:  None.   FINDINGS:  An approximately 10 cm right ovarian mass with torsion.  Normal  appearing left ovary and fallopian tube status post bilateral tubal  ligation.  Small fibroid uterus weighing 176 g.   DESCRIPTION OF PROCEDURE:  The patient was taken to the operating room after  the risks, benefits, and alternatives were discussed with the patient, the  patient verbalized an understanding and consent signed and witnessed. The  patient was placed under general anesthesia and prepped and draped in the  normal sterile fashion.  A Pfannenstiel skin incision was made and carried  down to the underlying layer of fascia with the Bovie. The fascia was  excised bilaterally in the midline with the Bovie and extended bilaterally  with the Bovie and Mayo scissors. Kocher clamps were placed in the superior  aspect of the fascial incision and the rectus muscle excised from the  fascia. The same was done on the inferior aspect of the fascial incision.  The muscle was separated in the midline and the peritoneum entered bluntly.  The peritoneal  incision was extended with the Bovie and Metzenbaum scissors.  Washings were collected and sent.  The right ovarian mass was easily  identified and the liver edge felt free of any masses. The bowel was packed  away and the [place] used for retraction.  The right ovarian mass was  amputated.  The uterus was elevated and the round ligaments were bilaterally  cut and suture ligated.  The uteroovarians were bilaterally then clamped,  cut and suture ligated.  The bladder flap was created and the bilateral  uterine vessels were skeletonized and clamped, cut and suture ligated.  The  bladder was dissected away from the cervix and in sequential fashion the  paracervical tissue was clamped, cut and suture ligated down to the level of  the external os incorporating the cardinal and uterosacral ligaments.  The  angles were clamped, cut and suture ligated using a fixation stitch.  The  cuff was identified and uterus and cervix excised.  The cuff was closed with  #0 Vicryl using interrupted figure-of-eight  stitches.  Attention was turned  to the remaining right adnexa which was grasped with a Babcock.  The right  ureter was identified and noted to peristalse normally. The right fallopian  tube and remaining pedicle was then clamped with a large Kelly, cut, tied  with a free tie and suture ligated.  #0 Vicryl was used throughout the case.  The intraabdominal cavity was copiously irrigated and found to be  hemostatic.  The uterosacral and angled stitches were tied together and then  the contralateral stitches were tied.  All pedicles were noted to be  hemostatic. The peritoneum was closed with #0 Vicryl in a running fashion.  The muscle was irrigated and made hemostatic with the Bovie although there  was minimal bleeding noted.  The fascia was repaired with #0 Vicryl in a  running fashion. The subcutaneous tissue was irrigated and made hemostatic  with the Bovie.  #0 plain sutures were placed to  reapproximate the  subcutaneous tissue.  The skin was closed with 4-0 Monocryl using a  subcuticular stitch. Sponge, lap and needle count was correct.  All  instruments and lap pads were removed prior to irrigation of the  intraabdominal cavity.  The patient tolerated the procedure well and was  returned to the recovery room in good condition.     Ange   AR/MEDQ  D:  10/11/2004  T:  10/11/2004  Job:  [id number]

## 2011-05-03 NOTE — Consult Note (Signed)
[name] Washington, [name] Washington NO.:  [id number]   MEDICAL RECORD NO.:  [id number]          PATIENT TYPE:  OUT   LOCATION:  GYN                          FACILITY:  [facility]   PHYSICIAN:  De Blanch, M.D.DATE OF BIRTH:  11/10/1961   DATE OF CONSULTATION:  DATE OF DISCHARGE:                                   CONSULTATION   A 50 year old African-American female seen in consultation at the request of  Dr. Osborn Coho regarding management of an ovarian cyst and fibroids.   The patient initially presented with abdominal pain in the right lower  quadrant, which was evaluated with a CT scan showing a cyst in the region of  the right ovary measuring 3.3 cm with some calcifications along one of the  margins.  She also is known to have uterine fibroids.  An ultrasound was  also obtained revealing a complex cystic lesion measuring 9.8 x 8.1 x 10.8  cm with color.  Doppler reveals flow signal within the lesion, and there are  numerous septations.  The left ovary appears normal.  There is no evidence  of free fluid.  CA-125 value is 29.5 U/ml.   Patient notes that her pain has resolved.  She has no GI, GU, or GYN  symptoms at the present time.   PAST MEDICAL HISTORY:  1.  Hypertension.  2.  Obesity.   PAST SURGICAL HISTORY:  1.  Cholecystectomy.  2.  Tubal ligation.   OBSTETRICAL HISTORY:  Gravida 1.   DRUG ALLERGIES:  PENICILLIN caused swelling.   CURRENT MEDICATIONS:  An unknown antihypertensive, calcium, and vitamins.   FAMILY HISTORY:  Negative for gynecologic, breast, or colon cancer.   SOCIAL HISTORY:  The patient is married.  She does not smoke.  She drives a  school bus.   REVIEW OF SYSTEMS:  Negative except as noted above.   PHYSICAL EXAMINATION:  VITAL SIGNS:  Height 4 feet 11 inches.  Weight 230  pounds.  Blood pressure 110/68, pulse 80, respiratory rate 18.  GENERAL:  Patient is a healthy, obese black female in no acute distress.  HEENT:   Negative.  NECK:  Supple without thyromegaly.  ABDOMEN:  Soft and nontender.  No mass, organomegaly, ascites, or hernias  are noted.  PELVIC:  EG/BUS, vagina, bladder, and urethra are normal.  The cervix is  parous and normal.  The uterus is difficult to outline.  There is a mass  effect approximately [redacted] weeks gestational size in the pelvis.  Rectovaginal  exam confirms there is no pelvic pain or tenderness.   IMPRESSION:  1.  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  2.  Fibroids, which are relatively asymptomatic.   I recommended to the patient, husband, and friend that she undergo  exploratory laparotomy for removal of the mass and a hysterectomy.  If the  opposite ovary appears normal, she would like to have it preserved for  hormone function.   I seriously doubt this is a malignancy and will try to contact Dr. Su Hilt  to make final determination as to who should be her primary surgeon.  The  patient would like to wait until the school holiday before she undergoes  surgery.     [name]   DC/MEDQ  D:  09/18/2004  T:  09/18/2004  Job:  [id number]   cc:   Osborn Coho, M.D.  Fax: [phone]   Telford Nab, R.N.  [address]  [place], [place] [address]

## 2011-05-03 NOTE — Discharge Summary (Signed)
[name] Washington, [name] Washington                ACCOUNT NO.:  [id number]   MEDICAL RECORD NO.:  [id number]          PATIENT TYPE:  INP   LOCATION:  9319                          FACILITY:  [facility]   PHYSICIAN:  Osborn Coho, M.D.   DATE OF BIRTH:  11-15-1961   DATE OF ADMISSION:  10/09/2004  DATE OF DISCHARGE:  10/13/2004                                 DISCHARGE SUMMARY   DISCHARGE DIAGNOSES:  1.  Pelvic pain.  2.  Complex right ovarian mass.  3.  Right ovarian torsion.  4.  Fibroid uterus.   OPERATION:  On hospital day #3, the patient underwent a total abdominal  hysterectomy with a right salpingo-oophorectomy, tolerating procedure well.  The patient was found to have a small fibroid uterus weighing 176 g, along  with a right ovarian mass measuring approximately 10 cm, along with torsion.  She had a normal-appearing left ovary and tubes bilaterally which were  previously ligated.   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  The patient  presented acutely with refractory pelvic pain believed secondary to this  mass and was therefore admitted for hydration and pain control.   HOSPITAL COURSE:  On the date of admission, the patient was placed on IV  fluids along with PCA Dilaudid as she had not received any relief from her  pain from parenteral Toradol.  The patient, prior to this admission, had  been previously scheduled to undergo an exploratory laparotomy for her right  adnexal mass on October 18, 2004.  However, due to her discomfort at the  time, it was decided to proceed with surgery during this admission.  The  patient underwent aforementioned procedures and tolerated it well.  Postoperative course was unremarkable with the patient tolerating a  hemoglobin of 10.1 (preoperative hemoglobin 11.5).  By postoperative day #2,  the patient had resumed bowel and bladder function and adequate pain  control, and was therefore deemed ready for discharge home.   DISCHARGE MEDICATIONS:  1.  Motrin 600 mg one tablet q.6h. as needed for pain (the patient was to      take with food).  2.  Percocet 5/325 one to two tablets q.4-6h. as needed for breakthrough      pain.  3.  Phenergan 12.5 mg one to two tablets q.6h. as needed for nausea.  4.  Colace one to two tablets daily until bowel movements are regular.   FOLLOW-UP:  The patient was scheduled for a 6 weeks postoperative visit with  Dr. Osborn Coho on November 19, 2004 at 10:45 a.m.   DISCHARGE INSTRUCTIONS:  The patient was given a copy of [facility] postoperative instruction sheet, which includes instructions to call  the doctor with severe abdominal pain, temperature greater than 100.4,  nausea, or vomiting.  She was further advised to avoid driving for 2 weeks,  heavy lifting for  4 weeks, and intercourse for 6 weeks.  The patient's diet  was without restriction.   FINAL PATHOLOGY:  Uterus and cervix with right fallopian tube and ovary:  Cervix:  Slight cervicitis and squamous metaplasia, proliferative  endometrium, no evidence of hyperplasia or malignancy.  Leiomyomata -  submucosal, intramural, and subserosal.  Benign right fallopian tube.  Right  ovary:  Extensive ovarian hemorrhage with focal cyst formation and focal  calcifications.  No endometriosis or malignancy identified.     Elmi   EJP/MEDQ  D:  10/31/2004  T:  10/31/2004  Job:  [id number]

## 2011-05-03 NOTE — Discharge Summary (Signed)
[facility]  Patient:    [name] Washington, [name] Washington                         MRN: [id number] Adm. Date:  [id number] Disc. Date: 08/22/00 Attending:  Fortino Sic                           Discharge Summary  ADMISSION DIAGNOSIS:  Symptomatic cholelithiasis.  DISCHARGE DIAGNOSIS:  Symptomatic cholelithiasis.  OPERATIONS:  August 21, 2000, laparoscopic cholecystectomy with intraoperative cholangiogram.  HISTORY OF PRESENT ILLNESS:  This is a 50 year old female with a several-hour history of severe upper abdominal pain.  Admission laboratory work revealed normal cardiac enzymes.  White count was 8300, hemoglobin was 12.4.  Lipase and amylase were normal.  Liver functions were normal.  Glucose was 155.  HOSPITAL COURSE:  The patient was admitted and prepared for surgery.  Surgery was performed.  A cholangiogram showed no dilatation of the common bile duct, but did not show flow into the duodenum.  Dr. Carman Ching was consulted, and we decided to observe the situation since there was no dilatation and a stone was not necessarily seen.  At the time of discharge the patient was up and about, tolerating a diet.  CONDITION ON DISCHARGE:  Stable.  DIET:  As tolerated.  ACTIVITY:  No lifting or calisthenics.  DISCHARGE MEDICATIONS:  Vicodin.  FOLLOW-UP:  She is to see me in seven days for wound care. DD:  08/22/00 TD:  08/24/00 Job: [id number]

## 2011-05-14 ENCOUNTER — Other Ambulatory Visit: Payer: Self-pay | Admitting: Internal Medicine

## 2012-06-26 ENCOUNTER — Other Ambulatory Visit: Payer: Self-pay | Admitting: Internal Medicine

## 2012-10-07 ENCOUNTER — Encounter: Payer: Self-pay | Admitting: *Deleted

## 2012-10-08 ENCOUNTER — Ambulatory Visit (INDEPENDENT_AMBULATORY_CARE_PROVIDER_SITE_OTHER): Payer: BC Managed Care – PPO | Admitting: Cardiovascular Disease

## 2012-10-08 ENCOUNTER — Encounter: Payer: Self-pay | Admitting: Cardiovascular Disease

## 2012-10-08 VITALS — BP 138/88 | HR 70

## 2012-10-08 DIAGNOSIS — R079 Chest pain, unspecified: Secondary | ICD-10-CM

## 2012-10-08 DIAGNOSIS — E1149 Type 2 diabetes mellitus with other diabetic neurological complication: Secondary | ICD-10-CM

## 2012-10-08 DIAGNOSIS — E785 Hyperlipidemia, unspecified: Secondary | ICD-10-CM

## 2012-10-08 DIAGNOSIS — I10 Essential (primary) hypertension: Secondary | ICD-10-CM

## 2012-10-08 NOTE — Patient Instructions (Addendum)
Your physician recommends that you schedule a follow-up appointment in: AS NEEDED  Your physician recommends that you continue on your current medications as directed. Please refer to the Current Medication list given to you today.  Your physician has requested that you have en exercise stress myoview. For further information please visit [email or web address]. Please follow instruction sheet, as given.  DX CHEST PAIN   

## 2012-10-08 NOTE — Assessment & Plan Note (Signed)
Discussed low carb diet.  Target hemoglobin A1c is 6.5 or less.  Continue current medications.  

## 2012-10-08 NOTE — Assessment & Plan Note (Signed)
Some compliance issues.  Discussed end vascular consequences of not taking pills Will see what BP does with exercise

## 2012-10-08 NOTE — Assessment & Plan Note (Signed)
Cholesterol is at goal.  Continue current dose of statin and diet Rx.  No myalgias or side effects.  F/U  LFT's in 6 months. Lab Results  Component Value Date   LDLCALC 85 10/20/2009

## 2012-10-08 NOTE — Progress Notes (Signed)
Patient ID: [name] Washington, female   DOB: 25-Mar-1961, 51 y.o.   MRN: [id number] 51 yo referred by [facility] at [facility] for chest pain.  Intermitant atypical sharp central chest with no radiation Can last hours.  No associated GI symptoms. No dyspnea .  Reviewed her stress test done by Dr Diona Browner in 2009  Nuclear images normal with HTN response to exercise and equivical ECG changes.  Previously diagnosed with chostrocondritis and Rx with NSAI's  Started 09/08/12  CRF;s include DM, HTN and elevated cholesterol on Rx.  She tries to stretch her BP pills out by taking them on alternate days I caustioned her about this Back in 2009 systolic BP went over 200 with only 4:46 of exercise  She tries a count school bus.  Otherwise sedentary.  Indicates last A1c was 6.7  ROS: Denies fever, malais, weight loss, blurry vision, decreased visual acuity, cough, sputum, SOB, hemoptysis, pleuritic pain, palpitaitons, heartburn, abdominal pain, melena, lower extremity edema, claudication, or rash.  All other systems reviewed and negative   General: Affect appropriate Obese Black female HEENT: normal Neck supple with no adenopathy JVP normal no bruits no thyromegaly Lungs clear with no wheezing and good diaphragmatic motion Heart:  S1/S2 no murmur,rub, gallop or click PMI normal Abdomen: benighn, BS positve, no tenderness, no AAA no bruit.  No HSM or HJR Distal pulses intact with no bruits No edema Neuro non-focal Skin warm and dry No muscular weakness  Medications Current Outpatient Prescriptions  Medication Sig Dispense Refill  . aspirin 81 MG tablet Take 81 mg by mouth daily.      Marland Kitchen gabapentin (NEURONTIN) 300 MG capsule Take 300 mg by mouth daily.      . metFORMIN (GLUCOPHAGE) 500 MG tablet Take 500 mg by mouth 2 (two) times daily with a meal.      . metoprolol succinate (TOPROL-XL) 25 MG 24 hr tablet Take 25 mg by mouth daily.      . ONE TOUCH ULTRA TEST test strip USE AS DIRECTED  100 each  0  . PARoxetine  (PAXIL) 10 MG tablet Take 10 mg by mouth every morning.      . quinapril-hydrochlorothiazide (ACCURETIC) 20-25 MG per tablet Take 1 tablet by mouth daily.      . simvastatin (ZOCOR) 40 MG tablet Take 40 mg by mouth every evening.        Allergies Penicillins  Family History: No family history on file.  Social History: History   Social History  . Marital Status: Married    Spouse Name: N/A    Number of Children: N/A  . Years of Education: N/A   Occupational History  . Not on file.   Social History Main Topics  . Smoking status: Not on file  . Smokeless tobacco: Not on file  . Alcohol Use: Not on file  . Drug Use: Not on file  . Sexually Active: Not on file   Other Topics Concern  . Not on file   Social History Narrative  . No narrative on file    Electrocardiogram:  NSR rate 70 nonspecific T wave changes LVH T wave inverted in 3  Assessment and Plan

## 2012-10-08 NOTE — Assessment & Plan Note (Signed)
Atypical but multiple risk factors including DM.  Baseline abnormal ECG  F/U stress myovue

## 2012-10-19 ENCOUNTER — Encounter (HOSPITAL_COMMUNITY): Payer: BC Managed Care – PPO

## 2013-06-30 DIAGNOSIS — K219 Gastro-esophageal reflux disease without esophagitis: Secondary | ICD-10-CM | POA: Insufficient documentation

## 2013-07-01 DIAGNOSIS — M1990 Unspecified osteoarthritis, unspecified site: Secondary | ICD-10-CM | POA: Insufficient documentation

## 2014-01-10 DIAGNOSIS — Z6841 Body Mass Index (BMI) 40.0 and over, adult: Secondary | ICD-10-CM | POA: Insufficient documentation

## 2014-01-10 DIAGNOSIS — Z789 Other specified health status: Secondary | ICD-10-CM | POA: Insufficient documentation

## 2014-03-16 DIAGNOSIS — F430 Acute stress reaction: Secondary | ICD-10-CM | POA: Insufficient documentation

## 2014-06-29 ENCOUNTER — Other Ambulatory Visit: Payer: Self-pay | Admitting: Gastroenterology

## 2014-07-27 ENCOUNTER — Encounter (HOSPITAL_COMMUNITY): Payer: Self-pay | Admitting: Pharmacy Technician

## 2014-08-02 ENCOUNTER — Encounter (HOSPITAL_COMMUNITY): Payer: Self-pay | Admitting: *Deleted

## 2014-08-15 ENCOUNTER — Encounter (HOSPITAL_COMMUNITY): Payer: BC Managed Care – PPO | Admitting: Anesthesiology

## 2014-08-15 ENCOUNTER — Encounter (HOSPITAL_COMMUNITY)
Admission: RE | Disposition: A | Discharge status: Home / Self Care | Payer: Self-pay | Source: Ambulatory Visit | Attending: Gastroenterology

## 2014-08-15 ENCOUNTER — Ambulatory Visit (HOSPITAL_COMMUNITY): Payer: BC Managed Care – PPO | Admitting: Anesthesiology

## 2014-08-15 ENCOUNTER — Encounter (HOSPITAL_COMMUNITY): Payer: Self-pay

## 2014-08-15 ENCOUNTER — Ambulatory Visit (HOSPITAL_COMMUNITY)
Admission: RE | Admit: 2014-08-15 | Discharge: 2014-08-15 | Disposition: A | Discharge status: Home / Self Care | Payer: BC Managed Care – PPO | Source: Ambulatory Visit | Attending: Gastroenterology | Admitting: Gastroenterology

## 2014-08-15 DIAGNOSIS — E7800 Pure hypercholesterolemia, unspecified: Secondary | ICD-10-CM | POA: Diagnosis not present

## 2014-08-15 DIAGNOSIS — D126 Benign neoplasm of colon, unspecified: Secondary | ICD-10-CM | POA: Insufficient documentation

## 2014-08-15 DIAGNOSIS — Z90710 Acquired absence of both cervix and uterus: Secondary | ICD-10-CM | POA: Diagnosis not present

## 2014-08-15 DIAGNOSIS — E119 Type 2 diabetes mellitus without complications: Secondary | ICD-10-CM | POA: Insufficient documentation

## 2014-08-15 DIAGNOSIS — Z880 Allergy status to penicillin: Secondary | ICD-10-CM | POA: Insufficient documentation

## 2014-08-15 DIAGNOSIS — I10 Essential (primary) hypertension: Secondary | ICD-10-CM | POA: Diagnosis not present

## 2014-08-15 DIAGNOSIS — Z1211 Encounter for screening for malignant neoplasm of colon: Secondary | ICD-10-CM | POA: Insufficient documentation

## 2014-08-15 DIAGNOSIS — F329 Major depressive disorder, single episode, unspecified: Secondary | ICD-10-CM | POA: Diagnosis not present

## 2014-08-15 DIAGNOSIS — F3289 Other specified depressive episodes: Secondary | ICD-10-CM | POA: Insufficient documentation

## 2014-08-15 HISTORY — PX: COLONOSCOPY WITH PROPOFOL: SHX5780

## 2014-08-15 LAB — GLUCOSE, CAPILLARY: Glucose-Capillary: 111 mg/dL — ABNORMAL HIGH (ref 70–99)

## 2014-08-15 SURGERY — COLONOSCOPY WITH PROPOFOL
Anesthesia: Monitor Anesthesia Care

## 2014-08-15 MED ORDER — PROPOFOL 10 MG/ML IV BOLUS
INTRAVENOUS | Status: AC
  Filled 2014-08-15: qty 20

## 2014-08-15 MED ORDER — PROPOFOL INFUSION 10 MG/ML OPTIME
INTRAVENOUS | Status: DC | PRN
  Administered 2014-08-15: 90 ug/kg/min via INTRAVENOUS

## 2014-08-15 MED ORDER — MIDAZOLAM HCL 2 MG/2ML IJ SOLN
INTRAMUSCULAR | Status: AC
  Filled 2014-08-15: qty 2

## 2014-08-15 MED ORDER — SODIUM CHLORIDE 0.9 % IV SOLN: INTRAVENOUS | Status: DC

## 2014-08-15 MED ORDER — LACTATED RINGERS IV SOLN
INTRAVENOUS | Status: DC
  Administered 2014-08-15: 1000 mL via INTRAVENOUS

## 2014-08-15 MED ORDER — MIDAZOLAM HCL 5 MG/5ML IJ SOLN
INTRAMUSCULAR | Status: DC | PRN
  Administered 2014-08-15: 2 mg via INTRAVENOUS

## 2014-08-15 SURGICAL SUPPLY — 21 items

## 2014-08-15 NOTE — Transfer of Care (Signed)
Immediate Anesthesia Transfer of Care Note  Patient: [name] Washington  Procedure(s) Performed: Procedure(s): COLONOSCOPY WITH PROPOFOL (N/A)  Patient Location: PACU  Anesthesia Type:MAC  Level of Consciousness: sedated  Airway & Oxygen Therapy: Patient Spontanous Breathing and Patient connected to nasal cannula oxygen  Post-op Assessment: Report given to PACU RN and Post -op Vital signs reviewed and stable  Post vital signs: Reviewed and stable  Complications: No apparent anesthesia complications

## 2014-08-15 NOTE — Anesthesia Postprocedure Evaluation (Signed)
  Anesthesia Post-op Note  Patient: [name] Washington  Procedure(s) Performed: Procedure(s): COLONOSCOPY WITH PROPOFOL (N/A)  Patient Location: PACU  Anesthesia Type:MAC  Level of Consciousness: awake, alert  and oriented  Airway and Oxygen Therapy: Patient Spontanous Breathing  Post-op Pain: none  Post-op Assessment: Post-op Vital signs reviewed  Post-op Vital Signs: Reviewed  Last Vitals:  Filed Vitals:   08/15/14 1100  BP: 125/64  Pulse: 51  Temp:   Resp: 11    Complications: No apparent anesthesia complications

## 2014-08-15 NOTE — H&P (Signed)
  Procedure: Baseline screening colonoscopy. Negative family history of colon cancer  History: The patient is a 54 year old female who is scheduled to undergo her first screening colonoscopy with polypectomy to prevent colon cancer. She denies a family history of colon cancer.  Medication allergies: Penicillin  Past medical history: Hypertension. Type 2 diabetes mellitus. Depression. Hypercholesterolemia. Hysterectomy.  Exam: The patient is alert and lying comfortably on the endoscopy stretcher. Abdomen is soft and nontender to palpation. Lungs are clear to auscultation. Cardiac exam reveals a regular rhythm.  Plan: Proceed with baseline screening colonoscopy

## 2014-08-15 NOTE — Op Note (Signed)
Procedure: Baseline screening colonoscopy  Endoscopist: Earle Gell  Premedication: Propofol administered by anesthesia  Procedure: The patient was placed in the left lateral decubitus position. Anal inspection and digital rectal exam were normal. The Pentax pediatric colonoscope was introduced into the rectum and advanced to the cecum. A normal-appearing appendiceal orifice was identified. A normal-appearing appendiceal orifice was intubated and the terminal ileum inspected. Colonic preparation for the exam today was good  Rectum. Normal. Retroflexed view of the distal rectum normal  Sigmoid colon and descending colon. Normal  Splenic flexure. Normal  Transverse colon. Normal  Hepatic flexure. A 12 mm sessile polyp was removed in piecemeal fashion with the electrocautery snare and submitted for pathological interpretation.  Ascending colon. Normal  Cecum and ileocecal valve. Normal  Terminal ileum. Normal    Assessment: From the hepatic flexure, a 12 mm sessile polyp was removed with the hot snare in piecemeal fashion and submitted for pathological interpretation. Otherwise normal colonoscopy  Recommendation: If hepatic flexure polyp returns neoplastic pathologically, the patient should undergo a surveillance colonoscopy in 3 years. If the polyp returns hyperplastic pathologically, the patient should undergo a repeat colonoscopy in 5 years

## 2014-08-15 NOTE — Discharge Instructions (Addendum)

## 2014-08-15 NOTE — Anesthesia Preprocedure Evaluation (Addendum)
Anesthesia Evaluation  Patient identified by MRN, date of birth, ID band Patient awake    Reviewed: Allergy & Precautions, H&P , NPO status , Patient's Chart, lab work & pertinent test results  Airway Mallampati: IV TM Distance: <3 FB Neck ROM: Full    Dental  (+) Teeth Intact, Dental Advisory Given   Pulmonary neg pulmonary ROS,  breath sounds clear to auscultation        Cardiovascular hypertension, Pt. on medications and Pt. on home beta blockers + angina Rhythm:Regular Rate:Normal  2009 Myoview stress test without evidence of scar or inducible ischemia. Low normal LV function. No changes in symptoms since. Occasional chest discomfort that pt states is relieved with naproxen.   Neuro/Psych negative neurological ROS  negative psych ROS   GI/Hepatic negative GI ROS, Neg liver ROS,   Endo/Other  diabetes, Type 2Morbid obesity  Renal/GU negative Renal ROS  negative genitourinary   Musculoskeletal negative musculoskeletal ROS (+)   Abdominal   Peds  Hematology negative hematology ROS (+)   Anesthesia Other Findings   Reproductive/Obstetrics negative OB ROS                          Anesthesia Physical Anesthesia Plan  ASA: III  Anesthesia Plan: MAC   Post-op Pain Management:    Induction: Intravenous  Airway Management Planned: Simple Face Mask  Additional Equipment:   Intra-op Plan:   Post-operative Plan:   Informed Consent: I have reviewed the patients History and Physical, chart, labs and discussed the procedure including the risks, benefits and alternatives for the proposed anesthesia with the patient or authorized representative who has indicated his/her understanding and acceptance.   Dental advisory given  Plan Discussed with: CRNA and Surgeon  Anesthesia Plan Comments:        Anesthesia Quick Evaluation

## 2014-08-16 ENCOUNTER — Encounter (HOSPITAL_COMMUNITY): Payer: Self-pay | Admitting: Gastroenterology

## 2015-04-17 ENCOUNTER — Ambulatory Visit (INDEPENDENT_AMBULATORY_CARE_PROVIDER_SITE_OTHER): Payer: BC Managed Care – PPO

## 2015-04-17 VITALS — BP 118/82 | HR 79

## 2015-04-17 DIAGNOSIS — I159 Secondary hypertension, unspecified: Secondary | ICD-10-CM

## 2015-04-17 NOTE — Progress Notes (Signed)
1.) Reason for visit: BP Check  2.) Name of MD requesting visit: Pt was a walk-in saw Dr. Johnsie Cancel in 09/2012  H&P: Pt poor historian, reviewed medications.  Pt has no c/o of SOB, dizzniess, or falls.  Arrive in our offices wants BP checked as she stated "it has been low on machine at home".  Pt lifted a heavy box a few weeks ago and has had some right sided chest strain since but was prescribed Naproxen 500 mg BID PRN, by Eldridge Abrahams her PCP at [facility], and it has been helping.  Pt stated that she has no CP otherwise. Pt BP evaluated 118/82 in left arm.  Pt and husband educated on monitoring BP daily and noting how she feels in her log and to call our office is she has any questions.

## 2015-04-17 NOTE — Patient Instructions (Signed)
Medication Instructions:  Your physician recommends that you continue on your current medications as directed. Please refer to the Current Medication list given to you today.    Follow-Up: Follow with our office as needed. Please call in advance to setup appointment.

## 2015-04-26 DIAGNOSIS — G4733 Obstructive sleep apnea (adult) (pediatric): Secondary | ICD-10-CM | POA: Insufficient documentation

## 2015-11-20 ENCOUNTER — Encounter: Payer: Self-pay | Admitting: Neurology

## 2015-11-20 ENCOUNTER — Ambulatory Visit (INDEPENDENT_AMBULATORY_CARE_PROVIDER_SITE_OTHER): Payer: BC Managed Care – PPO | Admitting: Neurology

## 2015-11-20 VITALS — BP 130/90 | HR 72 | Resp 16 | Ht 59.0 in | Wt 223.0 lb

## 2015-11-20 DIAGNOSIS — G4719 Other hypersomnia: Secondary | ICD-10-CM

## 2015-11-20 DIAGNOSIS — R0683 Snoring: Secondary | ICD-10-CM

## 2015-11-20 NOTE — Progress Notes (Signed)
Subjective:    Patient ID: [name] Washington is a 54 y.o. female.  HPI     Star Age, MD, PhD [facility] [address], [address] [place], [place] [address]  Dear [name],    I saw your patient, [name] Washington, upon your kind request in my neurologic clinic today for initial consultation of her sleep disorder, in particular, concern for underlying sleep apnea. The patient is accompanied by her husband today. As you know, [name] Washington is a 54 year old right-handed woman with an underlying medical history of hyperlipidemia, hypertension, reflux disease, depression, type 2 diabetes, and severe obesity, who reports snoring and some excessive daytime somnolence. She denies AM headaches and RLS type symptoms and no leg twitching per husband and she has no nocturia. 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 Her bedtime is between 10 and 10:30 PM. Rise time is 5 AM. She feels adequately rested first thing in the morning. She denies any family history of obstructive sleep apnea. She would be willing to try out CPAP therapy if indicated. She drinks caffeine in the form of coffee, usually 1 cup in the morning and occasional sodas. She drinks alcohol very occasionally. She is a nonsmoker. She lives with her husband and her son. Her Epworth sleepiness score is 7 out of 24, her fatigue score is 18 out of 63 today.  I reviewed your office note from 06/21/15, which you kindly included.   Her Past Medical History Is Significant For: Past Medical History  Diagnosis Date  . DIABETES MELLITUS   . DIAB W/NEURO MANIFESTS TYPE II/UNS NOT UNCNTRL   . HYPERLIPIDEMIA, MILD   . HYPERTENSION   . CHEST PAIN, ACUTE   . Esophageal reflux   . Osteoarthritis   . Depression     Her Past Surgical History Is Significant For: Past Surgical History  Procedure Laterality Date  . Cholecystectomy    . Abdominal hysterectomy      partial  . Colonoscopy with propofol N/A 08/15/2014    Procedure:  COLONOSCOPY WITH PROPOFOL;  Surgeon: Garlan Fair, MD;  Location: [facility];  Service: Endoscopy;  Laterality: N/A;  . Salpingoophorectomy Right     Her Family History Is Significant For: Family History  Problem Relation Age of Onset  . Hypertension Mother   . Heart failure Father   . Prostate cancer Father   . Hypertension Father   . Cancer Paternal Aunt     Her Social History Is Significant For: Social History   Social History  . Marital Status: Married    Spouse Name: N/A  . Number of Children: 1  . Years of Education: 57   Social History Main Topics  . Smoking status: Never Smoker   . Smokeless tobacco: None  . Alcohol Use: 0.0 oz/week    0 Standard drinks or equivalent per week     Comment: occassionally beer  . Drug Use: No  . Sexual Activity: Not Asked   Other Topics Concern  . None   Social History Narrative   Drinks coffee, tea, soda daily     Her Allergies Are:  Allergies  Allergen Reactions  . Penicillins     REACTION: causes face swelling  :   Her Current Medications Are:  Outpatient Encounter Prescriptions as of 11/20/2015  Medication Sig  . aspirin EC 81 MG tablet Take 81 mg by mouth daily.  Marland Kitchen atorvastatin (LIPITOR) 20 MG tablet Take 20 mg by mouth.  . fexofenadine (ALLEGRA)  60 MG tablet Take 60 mg by mouth 2 (two) times daily.  . fluticasone (FLONASE) 50 MCG/ACT nasal spray Place into both nostrils daily.  . furosemide (LASIX) 20 MG tablet Take 20 mg by mouth every morning.  . gabapentin (NEURONTIN) 300 MG capsule Take 300 mg by mouth every morning.   . metFORMIN (GLUCOPHAGE) 500 MG tablet TAKE ONE TABLET BY MOUTH THREE TIMES DAILY WITH MEALS  . metoprolol succinate (TOPROL-XL) 25 MG 24 hr tablet   . naproxen (NAPROSYN) 500 MG tablet Take 500 mg by mouth 2 (two) times daily as needed.  Marland Kitchen PARoxetine (PAXIL) 10 MG tablet Take 10 mg by mouth every morning.  . quinapril-hydrochlorothiazide (ACCURETIC) 20-12.5 MG tablet Take by mouth.  .  [DISCONTINUED] metFORMIN (GLUCOPHAGE) 500 MG tablet Take 500-1,000 mg by mouth 2 (two) times daily with a meal. 2 in the morning and 1 at night  . [DISCONTINUED] quinapril-hydrochlorothiazide (ACCURETIC) 20-12.5 MG per tablet Take 1 tablet by mouth 2 (two) times daily.  . [DISCONTINUED] simvastatin (ZOCOR) 40 MG tablet Take 40 mg by mouth at bedtime.   . [DISCONTINUED] vitamin B-12 (CYANOCOBALAMIN) 1000 MCG tablet Take 1,000 mcg by mouth daily.   No facility-administered encounter medications on file as of 11/20/2015.  :  Review of Systems:  Out of a complete 14 point review of systems, all are reviewed and negative with the exception of these symptoms as listed below:    Review of Systems  Constitutional:       Weight gain and loss  Neurological:       No trouble falling or staying asleep, snoring, sometimes takes naps during day.   Psychiatric/Behavioral:       Depression, not enough sleep   Epworth Sleepiness Scale 0= would never doze 1= slight chance of dozing 2= moderate chance of dozing 3= high chance of dozing  Sitting and reading:1 Watching TV:2 Sitting inactive in a public place (ex. Theater or meeting):1 As a passenger in a car for an hour without a break:0 Lying down to rest in the afternoon:1 Sitting and talking to someone:1 Sitting quietly after lunch (no alcohol):1 In a car, while stopped in traffic:0 Total:7  Objective:  Neurologic Exam  Physical Exam Physical Examination:   Filed Vitals:   11/20/15 0914  BP: 130/90  Pulse: 72  Resp: 16    General Examination: The patient is a very pleasant 54 y.o. female in no acute distress. She appears well-developed and well-nourished and well groomed.   HEENT: Normocephalic, atraumatic, pupils are equal, round and reactive to light and accommodation. Funduscopic exam is normal with sharp disc margins noted. Extraocular tracking is good without limitation to gaze excursion or nystagmus noted. Normal smooth pursuit  is noted. Hearing is grossly intact. Tympanic membranes are clear bilaterally. Face is symmetric with normal facial animation and normal facial sensation. Speech is clear with no dysarthria noted. There is no hypophonia. There is no lip, neck/head, jaw or voice tremor. Neck is supple with full range of passive and active motion. There are no carotid bruits on auscultation. Oropharynx exam reveals: mild mouth dryness, adequate dental hygiene and moderate airway crowding, due to narrow airway entry, larger tongue. I could not fully visualize her uvula or tonsils. Mallampati is class III. Tongue protrudes centrally and palate elevates symmetrically. Neck size is 16.25 inches. She has a Moderate overbite. Nasal inspection reveals no significant nasal mucosal bogginess or redness and no septal deviation.   Chest: Clear to auscultation without wheezing, rhonchi or crackles  noted.  Heart: S1+S2+0, regular and normal without murmurs, rubs or gallops noted.   Abdomen: Soft, non-tender and non-distended with normal bowel sounds appreciated on auscultation.  Extremities: There is trace pitting edema in the distal lower extremities bilaterally. Pedal pulses are intact.  Skin: Warm and dry without trophic changes noted. There are no varicose veins.  Musculoskeletal: exam reveals no obvious joint deformities, tenderness or joint swelling or erythema.   Neurologically:  Mental status: The patient is awake, alert and oriented in all 4 spheres. Her immediate and remote memory, attention, language skills and fund of knowledge are appropriate. There is no evidence of aphasia, agnosia, apraxia or anomia. Speech is clear with normal prosody and enunciation. Thought process is linear. Mood is normal and affect is normal.  Cranial nerves II - XII are as described above under HEENT exam. In addition: shoulder shrug is normal with equal shoulder height noted. Motor exam: Normal bulk, strength and tone is noted. There is no  drift, tremor or rebound. Romberg is negative. Reflexes are 2+ throughout. Babinski: Toes are flexor bilaterally. Fine motor skills and coordination: intact with normal finger taps, normal hand movements, normal rapid alternating patting, normal foot taps and normal foot agility.  Cerebellar testing: No dysmetria or intention tremor on finger to nose testing. Heel to shin is unremarkable bilaterally. There is no truncal or gait ataxia.  Sensory exam: intact to light touch, pinprick, vibration, temperature sense in the upper and lower extremities.  Gait, station and balance: She stands easily. No veering to one side is noted. No leaning to one side is noted. Posture is age-appropriate and stance is narrow based. Gait shows normal stride length and normal pace. No problems turning are noted. She turns en bloc. Tandem walk is unremarkable.    Assessment and Plan:  In summary, DEVYNE BREIDINGER is a very pleasant 54 y.o.-year old female with an underlying medical history of hyperlipidemia, hypertension, reflux disease, depression, type 2 diabetes, and severe obesity, whose history and physical exam are concerning for obstructive sleep apnea (OSA). I had a long chat with the patient and her husband about my findings and the diagnosis of OSA, its prognosis and treatment options. We talked about medical treatments, surgical interventions and non-pharmacological approaches. I explained in particular the risks and ramifications of untreated moderate to severe OSA, especially with respect to developing cardiovascular disease down the Road, including congestive heart failure, difficult to treat hypertension, cardiac arrhythmias, or stroke. Even type 2 diabetes has, in part, been linked to untreated OSA. Symptoms of untreated OSA include daytime sleepiness, memory problems, mood irritability and mood disorder such as depression and anxiety, lack of energy, as well as recurrent headaches, especially morning headaches. We  talked about trying to maintain a healthy lifestyle in general, as well as the importance of weight control. I encouraged the patient to eat healthy, exercise daily and keep well hydrated, to keep a scheduled bedtime and wake time routine, to not skip any meals and eat healthy snacks in between meals. I advised the patient not to drive when feeling sleepy. I recommended the following at this time: sleep study with potential positive airway pressure titration. (We will score hypopneas at 3% and split the sleep study into diagnostic and treatment portion, if the estimated. 2 hour AHI is >15/h).   I explained the sleep test procedure to the patient and also outlined possible surgical and non-surgical treatment options of OSA, including the use of a custom-made dental device (which would require  a referral to a specialist dentist or oral surgeon), upper airway surgical options, such as pillar implants, radiofrequency surgery, tongue base surgery, and UPPP (which would involve a referral to an ENT surgeon). Rarely, jaw surgery such as mandibular advancement may be considered.  I also explained the CPAP treatment option to the patient, who indicated that she would be willing to try CPAP if the need arises. I explained the importance of being compliant with PAP treatment, not only for insurance purposes but primarily to improve Her symptoms, and for the patient's long term health benefit, including to reduce Her cardiovascular risks. I answered all her questions today and the patient and her husband were in agreement. I would like to see her back after the sleep study is completed and encouraged her to call with any interim questions, concerns, problems or updates.   Thank you very much for allowing me to participate in the care of this nice patient. If I can be of any further assistance to you please do not hesitate to call me at [phone].  Sincerely,   Star Age, MD, PhD

## 2015-11-20 NOTE — Patient Instructions (Signed)

## 2015-11-27 ENCOUNTER — Telehealth: Payer: Self-pay | Admitting: Neurology

## 2015-11-28 ENCOUNTER — Encounter: Payer: Self-pay | Admitting: Neurology

## 2015-12-11 ENCOUNTER — Ambulatory Visit (INDEPENDENT_AMBULATORY_CARE_PROVIDER_SITE_OTHER): Payer: BC Managed Care – PPO | Admitting: Neurology

## 2015-12-11 DIAGNOSIS — G4720 Circadian rhythm sleep disorder, unspecified type: Secondary | ICD-10-CM

## 2015-12-11 DIAGNOSIS — G4733 Obstructive sleep apnea (adult) (pediatric): Secondary | ICD-10-CM

## 2015-12-11 DIAGNOSIS — G479 Sleep disorder, unspecified: Secondary | ICD-10-CM

## 2015-12-12 NOTE — Sleep Study (Signed)
Please see the scanned sleep study interpretation located in the Procedure tab within the Chart Review section. 

## 2015-12-14 ENCOUNTER — Telehealth: Payer: Self-pay | Admitting: Neurology

## 2015-12-14 DIAGNOSIS — G4733 Obstructive sleep apnea (adult) (pediatric): Secondary | ICD-10-CM

## 2015-12-14 NOTE — Telephone Encounter (Signed)
Husband answered the phone and could not find the patient. He will have her call us back.

## 2015-12-14 NOTE — Telephone Encounter (Signed)
[name] Washington called back, I gave her results and recommendations. She voiced understanding and would like to proceed with titration study. I will send copy of study to PCP.

## 2015-12-14 NOTE — Telephone Encounter (Signed)
Patient referred by Dr. Coletta Memos, seen by me on 11/20/15, diagnostic PSG on 12/11/15, ins: [employer].   Please call and notify the patient that the recent sleep study did confirm the diagnosis of Moderate obstructive sleep apnea and that I recommend treatment for this in the form of CPAP. This will require a repeat sleep study for proper titration and mask fitting. Please explain to patient and arrange for a CPAP titration study. I have placed an order in the chart. Thanks, and please route to [facility] for scheduling next sleep study.  Star Age, MD, PhD [facility])

## 2015-12-19 NOTE — Telephone Encounter (Signed)
Pt would like to speak the nurse pertaining to her results. Please call and advise [phone]

## 2015-12-20 NOTE — Telephone Encounter (Signed)
Patient called to verify that I sent results to PCP, which I have. She also requested to p/u a copy whic I have placed at front desk.

## 2016-01-05 ENCOUNTER — Ambulatory Visit (INDEPENDENT_AMBULATORY_CARE_PROVIDER_SITE_OTHER): Payer: BC Managed Care – PPO | Admitting: Neurology

## 2016-01-05 DIAGNOSIS — G4733 Obstructive sleep apnea (adult) (pediatric): Secondary | ICD-10-CM

## 2016-01-05 DIAGNOSIS — G4720 Circadian rhythm sleep disorder, unspecified type: Secondary | ICD-10-CM

## 2016-01-06 NOTE — Sleep Study (Signed)
Please see the scanned sleep study interpretation located in the procedure tab within the chart review section.   

## 2016-01-11 ENCOUNTER — Telehealth: Payer: Self-pay | Admitting: Neurology

## 2016-01-11 DIAGNOSIS — G4733 Obstructive sleep apnea (adult) (pediatric): Secondary | ICD-10-CM

## 2016-01-11 NOTE — Telephone Encounter (Signed)
Patient referred by Dr. Coletta Memos, seen by me on 11/20/15, diagnostic PSG on 12/11/15, CPAP study on 01/05/16, ins: [employer].  Please call and inform patient that I have entered an order for treatment with positive airway pressure (PAP) treatment of obstructive sleep apnea (OSA). She did well during the latest sleep study with CPAP. We will, therefore, arrange for a machine for home use through a DME (durable medical equipment) company of Her choice; and I will see the patient back in follow-up in about 8-10 weeks. Please also explain to the patient that I will be looking out for compliance data, which can be downloaded from the machine (stored on an SD card, that is inserted in the machine) or via remote access through a modem, that is built into the machine. At the time of the followup appointment we will discuss sleep study results and how it is going with PAP treatment at home. Please advise patient to bring Her machine at the time of the first FU visit, even though this is cumbersome. Bringing the machine for every visit after that will likely not be needed, but often helps for the first visit to troubleshoot if needed. Please re-enforce the importance of compliance with treatment and the need for [place] to monitor compliance data - often an insurance requirement and actually good feedback for the patient as far as how they are doing.  Also remind patient, that any interim PAP machine or mask issues should be first addressed with the DME company, as they can often help better with technical and mask fit issues. Please ask if patient has a preference regarding DME company.  Please also make sure, the patient has a follow-up appointment with me in about 8-10 weeks from the setup date, thanks.  Once you have spoken to the patient - and faxed/routed report to PCP and referring MD (if other than PCP), you can close this encounter, thanks,   Star Age, MD, PhD [facility] ([place])

## 2016-01-11 NOTE — Telephone Encounter (Signed)
I left message for patient to call back.

## 2016-01-12 NOTE — Telephone Encounter (Signed)
pls call her back, thx

## 2016-01-12 NOTE — Telephone Encounter (Signed)
Patient is returning a call regarding her sleep study and would like a returned call.  Thank you.

## 2016-01-15 NOTE — Telephone Encounter (Signed)
I spoke to patient and is aware of results and recommendations. She is willing to start treatment. I will send referral to [facility]. Patient made f/u appt. I will send her a letter reminding her to keep appt and stress the importance of compliance.

## 2016-01-24 ENCOUNTER — Telehealth: Payer: Self-pay

## 2016-01-24 DIAGNOSIS — R0683 Snoring: Secondary | ICD-10-CM

## 2016-01-24 DIAGNOSIS — G4733 Obstructive sleep apnea (adult) (pediatric): Secondary | ICD-10-CM

## 2016-01-24 NOTE — Telephone Encounter (Signed)
For moderate to severe obstructive sleep apnea, using CPAP is the best option. Sometimes we will request surgical opinion from ENT. I would really like for her to reconsider using CPAP first as this is a very successful treatment option, which works right away as opposed to other treatment options. Please recheck with her. If she is adamant that she does not want to do CPAP therapy, I can request surgical consultation with ENT.

## 2016-01-24 NOTE — Telephone Encounter (Signed)
This is the email that I received from [facility],   I have contacted this patient in regards to the order for Cpap. Patient has a $1250.00 Deductible with her Insurance and she will owe $123.00 at setup and monthly for the rental of the machine for 3 to 10 months depending on her plan before the machine will convert to purchase. Patient has declined order and stated that she will follow-up with Dr Rexene Alberts.   Thanks   [employer]

## 2016-01-24 NOTE — Telephone Encounter (Signed)
Patient is calling regarding the order for a CPAP. She states she does not have the money for the CPAP and wants to know what else she can do. Please call to discuss. The patient will be available today only until 1:30pm.

## 2016-01-25 NOTE — Telephone Encounter (Signed)
LM to call back.

## 2016-01-29 NOTE — Telephone Encounter (Signed)
I spoke to patient and she is willing to get referral. I will put referral in. Patient asked for both sleep studies to be sent to her NP, they have been faxed. Per patient request I will also send her a copy of second sleep study.

## 2016-01-29 NOTE — Addendum Note (Signed)
Addended by: Laurence Spates on: 01/29/2016 11:15 AM   Modules accepted: Orders

## 2016-02-20 NOTE — Telephone Encounter (Addendum)
Pt is requesting her referral be sent to [facility] , she needs to check on pricing for a CPAP.May call pt at [phone], [phone]

## 2016-02-20 NOTE — Telephone Encounter (Signed)
I let [facility] know that patient wants to switch DME companies. I let Mary with [facility] know we have orders for patient.

## 2016-05-30 ENCOUNTER — Ambulatory Visit (INDEPENDENT_AMBULATORY_CARE_PROVIDER_SITE_OTHER): Payer: BC Managed Care – PPO | Admitting: Neurology

## 2016-05-30 ENCOUNTER — Encounter: Payer: Self-pay | Admitting: Neurology

## 2016-05-30 VITALS — BP 120/82 | HR 82 | Resp 20 | Ht 59.0 in | Wt 218.0 lb

## 2016-05-30 DIAGNOSIS — G4733 Obstructive sleep apnea (adult) (pediatric): Secondary | ICD-10-CM

## 2016-05-30 DIAGNOSIS — Z9989 Dependence on other enabling machines and devices: Principal | ICD-10-CM

## 2016-05-30 NOTE — Patient Instructions (Addendum)
Please continue using your CPAP regularly. While your insurance requires that you use CPAP at least 4 hours each night on 70% of the nights, I recommend, that you not skip any nights and use it throughout the night if you can. Getting used to CPAP and staying with the treatment long term does take time and patience and discipline. Untreated obstructive sleep apnea when it is moderate to severe can have an adverse impact on cardiovascular health and raise her risk for heart disease, arrhythmias, hypertension, congestive heart failure, stroke and diabetes. Untreated obstructive sleep apnea causes sleep disruption, nonrestorative sleep, and sleep deprivation. This can have an impact on your day to day functioning and cause daytime sleepiness and impairment of cognitive function, memory loss, mood disturbance, and problems focussing. Using CPAP regularly can improve these symptoms.  Keep up the good work! I will see you back in 6 months for sleep apnea check up, and if you continue to do well on CPAP I will see you once a year thereafter.   Please keep working on weight loss, you have lost weight, please keep that momentum going!

## 2016-05-30 NOTE — Progress Notes (Signed)
Subjective:    Patient ID: [name] Washington is a 55 y.o. female.  HPI     Interim history:   [name] Washington is a 55 year old right-handed woman with an underlying medical history of hyperlipidemia, hypertension, reflux disease, depression, type 2 diabetes, and severe obesity, who presents with follow-up consultation of her obstructive sleep apnea, after her recent sleep studies. The patient is unaccompanied today. I first met her on 11/20/2015 at the request of her primary care provider, at which time the patient reported snoring and excessive daytime somnolence. I invited her back for sleep study. She had a baseline sleep study, followed by a CPAP titration study. I went over her test results with her in detail today. Her baseline sleep study from 12/11/2015 showed a sleep efficiency of 58.9% with a prolonged sleep latency of 73.5 minutes and wake after sleep onset of 104.5 minutes with moderate to severe sleep fragmentation noted. She had an elevated arousal index. She had an increased percentage of light stage sleep, absence of slow-wave sleep and a mildly decreased percentage of REM sleep with a prolonged REM latency. She had no significant PLMS, EKG or EEG changes. She had moderate to loud snoring. Total AHI was 26.3 per hour, rising to 77.8 per hour during REM sleep and 37.9 per hour during the supine position. Average oxygen saturation was 97%, nadir was 85%. Based on her test results and her sleep-related complaints I invited her back for a full night CPAP titration study. She had this on 01/05/2016. Sleep efficiency was 86.3%, latency to sleep 19.5 minutes and wake after sleep onset 36 minutes with mild to moderate sleep fragmentation noted. She had a normal arousal index. She had an increased percentage of stage II sleep, absence of slow-wave sleep and an increased percentage of REM sleep with a mildly reduced REM latency. She had no significant PLMS, EKG or EEG changes. CPAP was titrated from 5 cm to  8 cm. AHI was 1.3 per hour at that pressure with supine REM sleep achieved. Average oxygen saturation was 95%, nadir was 87%. Based on her test results are prescribed CPAP therapy for home use.  Today, 05/30/2016: I reviewed her CPAP compliance data from 04/29/2016 through 05/28/2016 which is a total of 30 days during which time she used her machine 30 days with percent used days greater than 4 hours at 100%, indicating superb compliance with an average usage of 7 hours and 34 minutes, residual AHI 2.4 per hour, leak low with the 95th percentile at 7.5 L/m on a pressure of 8 cm with EPR of 3.   Today, 05/30/2016: She reports doing well, has adapted to treatment quite well. Feels like she is resting better and sleep is less disrupted, wakes up better rested. She does have stressors in particular taking care of her ailing husband and son with CP. In addition, she works full-time. She has lost a little bit of weight, has not been trying particularly hard though. Blood pressure looks better as well. She has an appointment with her primary care provider next month, eye appointment also pending.  Previously:   11/20/2015: She reports snoring and some excessive daytime somnolence. She denies AM headaches and RLS type symptoms and no leg twitching per husband and she has no nocturia. 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 Her bedtime is between 10 and 10:30 PM. Rise time is 5 AM. She feels adequately rested first thing in the morning. She denies any family history of obstructive sleep apnea.  She would be willing to try out CPAP therapy if indicated. She drinks caffeine in the form of coffee, usually 1 cup in the morning and occasional sodas. She drinks alcohol very occasionally. She is a nonsmoker. She lives with her husband and her son. Her Epworth sleepiness score is 7 out of 24, her fatigue score is 18 out of 63 today.  I reviewed your office note from 06/21/15, which you kindly included.  Her Past Medical History  Is Significant For: Past Medical History  Diagnosis Date  . DIABETES MELLITUS   . DIAB W/NEURO MANIFESTS TYPE II/UNS NOT UNCNTRL   . HYPERLIPIDEMIA, MILD   . HYPERTENSION   . CHEST PAIN, ACUTE   . Esophageal reflux   . Osteoarthritis   . Depression     Her Past Surgical History Is Significant For: Past Surgical History  Procedure Laterality Date  . Cholecystectomy    . Abdominal hysterectomy      partial  . Colonoscopy with propofol N/A 08/15/2014    Procedure: COLONOSCOPY WITH PROPOFOL;  Surgeon: Martin K Johnson, MD;  Location: [facility];  Service: Endoscopy;  Laterality: N/A;  . Salpingoophorectomy Right     Her Family History Is Significant For: Family History  Problem Relation Age of Onset  . Hypertension Mother   . Heart failure Father   . Prostate cancer Father   . Hypertension Father   . Cancer Paternal Aunt     Her Social History Is Significant For: Social History   Social History  . Marital Status: Married    Spouse Name: N/A  . Number of Children: 1  . Years of Education: 12   Social History Main Topics  . Smoking status: Never Smoker   . Smokeless tobacco: None  . Alcohol Use: 0.0 oz/week    0 Standard drinks or equivalent per week     Comment: occassionally beer  . Drug Use: No  . Sexual Activity: Not Asked   Other Topics Concern  . None   Social History Narrative   Drinks coffee, tea, soda daily     Her Allergies Are:  Allergies  Allergen Reactions  . Penicillins     REACTION: causes face swelling  :   Her Current Medications Are:  Outpatient Encounter Prescriptions as of 05/30/2016  Medication Sig  . aspirin EC 81 MG tablet Take 81 mg by mouth daily.  . atorvastatin (LIPITOR) 20 MG tablet Take 20 mg by mouth.  . gabapentin (NEURONTIN) 300 MG capsule Take 300 mg by mouth every morning.   . metFORMIN (GLUCOPHAGE) 500 MG tablet TAKE ONE TABLET BY MOUTH THREE TIMES DAILY WITH MEALS  . metoprolol succinate (TOPROL-XL) 25 MG 24 hr  tablet   . naproxen (NAPROSYN) 500 MG tablet Take 500 mg by mouth 2 (two) times daily as needed.  . PARoxetine (PAXIL) 10 MG tablet Take 10 mg by mouth every morning.  . quinapril-hydrochlorothiazide (ACCURETIC) 20-12.5 MG tablet Take by mouth.  . [DISCONTINUED] fexofenadine (ALLEGRA) 60 MG tablet Take 60 mg by mouth 2 (two) times daily.  . [DISCONTINUED] fluticasone (FLONASE) 50 MCG/ACT nasal spray Place into both nostrils daily.  . [DISCONTINUED] furosemide (LASIX) 20 MG tablet Take 20 mg by mouth every morning.   No facility-administered encounter medications on file as of 05/30/2016.  :  Review of Systems:  Out of a complete 14 point review of systems, all are reviewed and negative with the exception of these symptoms as listed below:   Review of Systems    Neurological:       No new neurological complaints. CPAP follow up.    Objective:  Neurologic Exam  Physical Exam Physical Examination:   Filed Vitals:   05/30/16 0833  BP: 120/82  Pulse: 82  Resp: 20    General Examination: The patient is a very pleasant 55 y.o. female in no acute distress. She appears well-developed and well-nourished and well groomed. She is in good spirits today.  HEENT: Normocephalic, atraumatic, pupils are equal, round and reactive to light and accommodation. Extraocular tracking is good without limitation to gaze excursion or nystagmus noted. Normal smooth pursuit is noted. Hearing is grossly intact. Face is symmetric with normal facial animation and normal facial sensation. Speech is clear with no dysarthria noted. There is no hypophonia. There is no lip, neck/head, jaw or voice tremor. Neck is supple with full range of passive and active motion. There are no carotid bruits on auscultation. Oropharynx exam reveals: mild mouth dryness, adequate dental hygiene and moderate airway crowding, due to narrow airway entry, larger tongue. I could not fully visualize her uvula or tonsils. Mallampati is class III.  Tongue protrudes centrally and palate elevates symmetrically.   Chest: Clear to auscultation without wheezing, rhonchi or crackles noted.  Heart: S1+S2+0, regular and normal without murmurs, rubs or gallops noted.   Abdomen: Soft, non-tender and non-distended with normal bowel sounds appreciated on auscultation.  Extremities: There is non-pitting puffiness around ankles.  Skin: Warm and dry without trophic changes noted. There are no varicose veins.  Musculoskeletal: exam reveals no obvious joint deformities, tenderness or joint swelling or erythema.   Neurologically:  Mental status: The patient is awake, alert and oriented in all 4 spheres. Her immediate and remote memory, attention, language skills and fund of knowledge are appropriate. There is no evidence of aphasia, agnosia, apraxia or anomia. Speech is clear with normal prosody and enunciation. Thought process is linear. Mood is normal and affect is normal.  Cranial nerves II - XII are as described above under HEENT exam. In addition: shoulder shrug is normal with equal shoulder height noted. Motor exam: Normal bulk, strength and tone is noted. There is no drift, tremor or rebound. Romberg is negative. Reflexes are 2+ throughout. Fine motor skills and coordination: intact with normal finger taps, normal hand movements, normal rapid alternating patting, normal foot taps and normal foot agility.  Cerebellar testing: No dysmetria or intention tremor on finger to nose testing. Heel to shin is unremarkable bilaterally. There is no truncal or gait ataxia.  Sensory exam: intact to light touch in the upper and lower extremities.  Gait, station and balance: She stands easily. No veering to one side is noted. No leaning to one side is noted. Posture is age-appropriate and stance is narrow based. Gait shows normal stride length and normal pace. No problems turning are noted. Tandem walk is unremarkable.    Assessment and Plan:  In summary, [name] Washington is a very pleasant 55 year old female with an underlying medical history of hyperlipidemia, hypertension, reflux disease, depression, type 2 diabetes, and severe obesity, who presents for follow-up consultation of her obstructive sleep apnea, after her recent sleep studies. She had a baseline sleep study in December 2016, followed by a CPAP titration study in January 2017. She has been on CPAP at 8 cm with good success and wonderful compliance. Her blood pressure is good today, weight has been down by about 5 pounds in the last 6 months, she feels improved in her sleep.  Today, we talked about her sleep study results in detail today, findings were explained in particular in comparison. Furthermore, we talked about her compliance data as well and she was highly commended for full compliance with her CPAP. She is advised to continue to strive for weight loss. We talked about untreated OSA and the risks for cardiovascular disease. We also talked about the importance of staying compliant with CPAP therapy, not only for insurance purposes but primarily to improve Her symptoms, and for the patient's long term health benefit, including to reduce Her cardiovascular risks. at this juncture, since she is doing well, I suggested a 6 month follow-up, sooner as needed.  I answered all her questions today and the patient was in agreement.  I spent 25 minutes in total face-to-face time with the patient, more than 50% of which was spent in counseling and coordination of care, reviewing test results, reviewing medication and discussing or reviewing the diagnosis of OSA, its prognosis and treatment options.

## 2016-07-09 ENCOUNTER — Telehealth: Payer: Self-pay | Admitting: Neurology

## 2016-07-09 NOTE — Telephone Encounter (Signed)
I reviewed her CPAP compliance data from 05/26/2016 through 06/24/2016 which is a total of 30 days during which time she used her machine every night with percent used days greater than 4 hours at 100%, indicating superb compliance with an average usage of 7 hours and 49 minutes, residual AHI 3 per hour, leaked low with the 95th percentile at 9.5 L/m on a pressure of 8 cm with EPR of 3. Patient can keep her appointment as scheduled for December 2017. No action required at this time.

## 2016-07-11 NOTE — Telephone Encounter (Signed)
I spoke to patient and she is aware of information below. Also wished her a happy early birthday. Patient does not need anything further at this time.

## 2016-08-05 ENCOUNTER — Telehealth: Payer: Self-pay

## 2016-08-05 NOTE — Telephone Encounter (Signed)
I spoke to patient and advised her that the records that [facility] requested have been faxed in. I have also placed a copy of the forms at the front desk for pick up per patient request.

## 2016-11-27 ENCOUNTER — Encounter: Payer: Self-pay | Admitting: Adult Health

## 2016-12-02 ENCOUNTER — Ambulatory Visit (INDEPENDENT_AMBULATORY_CARE_PROVIDER_SITE_OTHER): Payer: BC Managed Care – PPO | Admitting: Neurology

## 2016-12-02 ENCOUNTER — Encounter: Payer: Self-pay | Admitting: Neurology

## 2016-12-02 VITALS — BP 134/77 | HR 64 | Resp 20 | Ht 59.0 in | Wt 225.0 lb

## 2016-12-02 DIAGNOSIS — Z9989 Dependence on other enabling machines and devices: Secondary | ICD-10-CM

## 2016-12-02 DIAGNOSIS — G4733 Obstructive sleep apnea (adult) (pediatric): Secondary | ICD-10-CM

## 2016-12-02 NOTE — Patient Instructions (Addendum)

## 2016-12-02 NOTE — Progress Notes (Signed)
fSubjective:    Patient ID: [name] Washington is a 55 y.o. female.  HPI     Interim history:   [name] Washington is a 55 year old right-handed woman with an underlying medical history of hyperlipidemia, hypertension, reflux disease, depression, type 2 diabetes, and severe obesity, who presents with follow-up consultation of her obstructive sleep apnea, on CPAP therapy at home. The patient is unaccompanied today. I last saw her on 05/30/2016, at which time we talked about her sleep study results in detail from her baseline sleep study in December 2016 as well as her CPAP titration study from January 2017. She was fully compliant with CPAP therapy. She reported doing well with regards to her sleep, felt like she was sleeping better, more consolidated, woke up better rested, daytime sleepiness was improved. She did have recent stressors in particular trying to take care of of her ailing husband and a son with CPAP. In addition, she was still working full time. She had lost a little bit of weight, blood pressure had improved.   Today, 12/02/2016: I reviewed her CPAP compliance data from 10/29/2016 through 11/27/2016, which is a total of 30 days, during which time she used her CPAP every night with percent used days greater than 4 hours at 100%, indicating superb compliance with an average usage of 7 hours and 3 minutes, residual AHI 2.9 per hour, leak on the low side with the 95th percentile at 11.1 L/m on a pressure of 8 cm with EPR of 3.  Today, 12/02/2016: She reports doing well with CPAP. Weight seems to fluctuate. She has recently received supplies. She feels that sleep is still well consolidated and good quality, less daytime somnolence as compared to before CPAP therapy. She does have stressors including her husband's health and she also takes care of her son with special needs.  Previously:    I first met her on 11/20/2015 at the request of her primary care provider, at which time the patient reported  snoring and excessive daytime somnolence. I invited her back for sleep study. She had a baseline sleep study, followed by a CPAP titration study. I went over her test results with her in detail today. Her baseline sleep study from 12/11/2015 showed a sleep efficiency of 58.9% with a prolonged sleep latency of 73.5 minutes and wake after sleep onset of 104.5 minutes with moderate to severe sleep fragmentation noted. She had an elevated arousal index. She had an increased percentage of light stage sleep, absence of slow-wave sleep and a mildly decreased percentage of REM sleep with a prolonged REM latency. She had no significant PLMS, EKG or EEG changes. She had moderate to loud snoring. Total AHI was 26.3 per hour, rising to 77.8 per hour during REM sleep and 37.9 per hour during the supine position. Average oxygen saturation was 97%, nadir was 85%. Based on her test results and her sleep-related complaints I invited her back for a full night CPAP titration study. She had this on 01/05/2016. Sleep efficiency was 86.3%, latency to sleep 19.5 minutes and wake after sleep onset 36 minutes with mild to moderate sleep fragmentation noted. She had a normal arousal index. She had an increased percentage of stage II sleep, absence of slow-wave sleep and an increased percentage of REM sleep with a mildly reduced REM latency. She had no significant PLMS, EKG or EEG changes. CPAP was titrated from 5 cm to 8 cm. AHI was 1.3 per hour at that pressure with supine REM sleep achieved. Average oxygen  saturation was 95%, nadir was 87%. Based on her test results are prescribed CPAP therapy for home use.   I reviewed her CPAP compliance data from 04/29/2016 through 05/28/2016 which is a total of 30 days during which time she used her machine 30 days with percent used days greater than 4 hours at 100%, indicating superb compliance with an average usage of 7 hours and 34 minutes, residual AHI 2.4 per hour, leak low with the 95th  percentile at 7.5 L/m on a pressure of 8 cm with EPR of 3.    11/20/2015: She reports snoring and some excessive daytime somnolence. She denies AM headaches and RLS type symptoms and no leg twitching per husband and she has no nocturia. 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 Her bedtime is between 10 and 10:30 PM. Rise time is 5 AM. She feels adequately rested first thing in the morning. She denies any family history of obstructive sleep apnea. She would be willing to try out CPAP therapy if indicated. She drinks caffeine in the form of coffee, usually 1 cup in the morning and occasional sodas. She drinks alcohol very occasionally. She is a nonsmoker. She lives with her husband and her son. Her Epworth sleepiness score is 7 out of 24, her fatigue score is 18 out of 63 today.   I reviewed your office note from 06/21/15, which you kindly included.  Her Past Medical History Is Significant For: Past Medical History:  Diagnosis Date  . CHEST PAIN, ACUTE   . Depression   . DIAB W/NEURO MANIFESTS TYPE II/UNS NOT UNCNTRL   . DIABETES MELLITUS   . Esophageal reflux   . HYPERLIPIDEMIA, MILD   . HYPERTENSION   . Osteoarthritis     Her Past Surgical History Is Significant For: Past Surgical History:  Procedure Laterality Date  . ABDOMINAL HYSTERECTOMY     partial  . CHOLECYSTECTOMY    . COLONOSCOPY WITH PROPOFOL N/A 08/15/2014   Procedure: COLONOSCOPY WITH PROPOFOL;  Surgeon: Garlan Fair, MD;  Location: [facility];  Service: Endoscopy;  Laterality: N/A;  . SALPINGOOPHORECTOMY Right     Her Family History Is Significant For: Family History  Problem Relation Age of Onset  . Hypertension Mother   . Heart failure Father   . Prostate cancer Father   . Hypertension Father   . Cancer Paternal Aunt     Her Social History Is Significant For: Social History   Social History  . Marital status: Married    Spouse name: N/A  . Number of children: 1  . Years of education: 4   Social History  Main Topics  . Smoking status: Never Smoker  . Smokeless tobacco: None  . Alcohol use 0.0 oz/week     Comment: occassionally beer  . Drug use: No  . Sexual activity: Not Asked   Other Topics Concern  . None   Social History Narrative   Drinks coffee, tea, soda daily     Her Allergies Are:  Allergies  Allergen Reactions  . Penicillins     REACTION: causes face swelling  :   Her Current Medications Are:  Outpatient Encounter Prescriptions as of 12/02/2016  Medication Sig  . aspirin EC 81 MG tablet Take 81 mg by mouth daily.  Marland Kitchen gabapentin (NEURONTIN) 300 MG capsule Take 300 mg by mouth every morning.   . metFORMIN (GLUCOPHAGE) 500 MG tablet TAKE ONE TABLET BY MOUTH THREE TIMES DAILY WITH MEALS  . metoprolol succinate (TOPROL-XL) 25 MG  24 hr tablet   . naproxen (NAPROSYN) 500 MG tablet Take 500 mg by mouth 2 (two) times daily as needed.  . quinapril-hydrochlorothiazide (ACCURETIC) 20-12.5 MG tablet Take by mouth.  Marland Kitchen atorvastatin (LIPITOR) 20 MG tablet Take 20 mg by mouth.  . [DISCONTINUED] PARoxetine (PAXIL) 10 MG tablet Take 10 mg by mouth every morning.   No facility-administered encounter medications on file as of 12/02/2016.   :  Review of Systems:  Out of a complete 14 point review of systems, all are reviewed and negative with the exception of these symptoms as listed below: Review of Systems  Neurological:       Pt presents today for a cpap compliance visit. Pt reports no problems with her cpap and is using [facility] as her DME with no problems.    Objective:  Neurologic Exam  Physical Exam Physical Examination:   Vitals:   12/02/16 1306  BP: 134/77  Pulse: 64  Resp: 20   General Examination: The patient is a very pleasant 55 y.o. female in no acute distress. She appears well-developed and well-nourished and well groomed. She is in good spirits today.  HEENT: Normocephalic, atraumatic, pupils are equal, round and reactive to light and accommodation. Extraocular  tracking is good without limitation to gaze excursion or nystagmus noted. Normal smooth pursuit is noted. Hearing is grossly intact. Face is symmetric with normal facial animation and normal facial sensation. Speech is clear with no dysarthria noted. There is no hypophonia. There is no lip, neck/head, jaw or voice tremor. Neck is supple with full range of passive and active motion. There are no carotid bruits on auscultation. Oropharynx exam reveals: mild mouth dryness, adequate dental hygiene and moderate airway crowding, due to narrow airway entry, larger tongue. I could not fully visualize her uvula or tonsils. Mallampati is class III. Tongue protrudes centrally and palate elevates symmetrically.   Chest: Clear to auscultation without wheezing, rhonchi or crackles noted.  Heart: S1+S2+0, regular and normal without murmurs, rubs or gallops noted.   Abdomen: Soft, non-tender and non-distended with normal bowel sounds appreciated on auscultation.  Extremities: There is non-pitting puffiness around ankles.  Skin: Warm and dry without trophic changes noted. There are no varicose veins.  Musculoskeletal: exam reveals no obvious joint deformities, tenderness or joint swelling or erythema.   Neurologically:  Mental status: The patient is awake, alert and oriented in all 4 spheres. Her immediate and remote memory, attention, language skills and fund of knowledge are appropriate. There is no evidence of aphasia, agnosia, apraxia or anomia. Speech is clear with normal prosody and enunciation. Thought process is linear. Mood is normal and affect is normal.  Cranial nerves II - XII are as described above under HEENT exam. In addition: shoulder shrug is normal with equal shoulder height noted. Motor exam: Normal bulk, strength and tone is noted. There is no drift, tremor or rebound. Romberg is negative. Reflexes are 1-2+ throughout. Fine motor skills and coordination: intact with normal finger taps, normal  hand movements, normal rapid alternating patting, normal foot taps and normal foot agility.  Cerebellar testing: No dysmetria or intention tremor on finger to nose testing. Heel to shin is unremarkable bilaterally. There is no truncal or gait ataxia.  Sensory exam: intact to light touch in the upper and lower extremities.  Gait, station and balance: She stands easily. No veering to one side is noted. No leaning to one side is noted. Posture is age-appropriate and stance is narrow based. Gait shows normal stride  length and normal pace. No problems turning are noted. Tandem walk is unremarkable.    Assessment and Plan:  In summary, [name] Washington is a very pleasant 55 year old female with an underlying medical history of hyperlipidemia, hypertension, reflux disease, depression, type 2 diabetes, and severe obesity, who presents for follow-up consultation of her obstructive sleep apnea, established on CPAP of 8 cm with full compliance. Physical exam is stable. She had a baseline sleep study in December 2016, which showed moderate to severe OSA, followed by a CPAP titration study in January 2017, which showed good results. We talked about her previous test results again briefly today. She has been on CPAP at 8 cm with good success and superb compliance. Her weight has been fluctuating within 5 pounds or so. She has gained a little bit of weight in the past 6 months. She has noted improvement in her sleep quality sleep consolidation and is commended for her treatment adherence. I this juncture, she is advised to continue to strive for weight loss and be completely compliant with her CPAP therapy. We talked about her most recent compliance data as well. She did not need any supplies today and recently received CPAP supplies.  At this point, she can follow-up in one year, she can see one of our nurse practitioner at the time and I can see her back after that.  I answered all her questions today and the patient was  in agreement.  I spent 25 minutes in total face-to-face time with the patient, more than 50% of which was spent in counseling and coordination of care, reviewing test results, reviewing medication and discussing or reviewing the diagnosis of OSA, its prognosis and treatment options.

## 2017-04-22 ENCOUNTER — Telehealth: Payer: Self-pay | Admitting: Neurology

## 2017-04-22 NOTE — Telephone Encounter (Signed)
Pt states she needs a copy /print out of sleep pattern of last 3 months from her CPAP and is asking where she can get that from, wrather [facility] or [facility], it is needed for her to continue to work.  Pt said if she is not avail please leave a message

## 2017-04-22 NOTE — Telephone Encounter (Signed)
LM for patient stating that I have put 3 month print out at front desk for her to pick up. If she needs me to mail it to her I asked her to call back and let me know. Otherwise it is at the front desk for pick up.

## 2017-12-01 ENCOUNTER — Encounter: Payer: Self-pay | Admitting: Adult Health

## 2017-12-03 ENCOUNTER — Encounter (INDEPENDENT_AMBULATORY_CARE_PROVIDER_SITE_OTHER): Payer: Self-pay

## 2017-12-03 ENCOUNTER — Ambulatory Visit: Payer: BC Managed Care – PPO | Admitting: Adult Health

## 2017-12-03 ENCOUNTER — Encounter: Payer: Self-pay | Admitting: Adult Health

## 2017-12-03 VITALS — BP 121/77 | HR 76 | Ht 59.0 in | Wt 225.8 lb

## 2017-12-03 DIAGNOSIS — G4733 Obstructive sleep apnea (adult) (pediatric): Secondary | ICD-10-CM

## 2017-12-03 DIAGNOSIS — Z9989 Dependence on other enabling machines and devices: Secondary | ICD-10-CM

## 2017-12-03 NOTE — Progress Notes (Addendum)
PATIENT: [name] Washington DOB: 06-28-1961  REASON FOR VISIT: follow up-obstructive sleep apnea on CPAP HISTORY FROM: patient  HISTORY OF PRESENT ILLNESS: [name] Washington is a 56 year old female with a history of obstructive sleep apnea on CPAP.  She returns today for follow-up.  Her CPAP download indicates that she used her machine 30 out of 30 days for compliance of 100%.  She use her machine greater than 4 hours 29 out of 30 days for compliance of 97%.  On average she uses her machine 6 hours and 9minutes her residual AHI is 3 on 8 cm of water with EPR of 3.  She does not have a significant leak.  She reports that the CPAP continues to work well for her.  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  She returns today for an evaluation.  History 12/02/2016:[name] Washington is a 55 year old right-handed woman with an underlying medical history of hyperlipidemia, hypertension, reflux disease, depression, type 2 diabetes, and severe obesity, who presents with follow-up consultation of her obstructive sleep apnea, on CPAP therapy at home. The patient is unaccompanied today. I last saw her on 05/30/2016, at which time we talked about her sleep study results in detail from her baseline sleep study in December 2016 as well as her CPAP titration study from January 2017. She was fully compliant with CPAP therapy. She reported doing well with regards to her sleep, felt like she was sleeping better, more consolidated, woke up better rested, daytime sleepiness was improved. She did have recent stressors in particular trying to take care of of her ailing husband and a son with CPAP. In addition, she was still working full time. She had lost a little bit of weight, blood pressure had improved.    12/02/2016: I reviewed her CPAP compliance data from 10/29/2016 through 11/27/2016, which is a total of 30 days, during which time she used her CPAP every  night with percent used days greater than 4 hours at 100%, indicating superb compliance with an average usage of 7 hours and 3 minutes, residual AHI 2.9 per hour, leak on the low side with the 95th percentile at 11.1 L/m on a pressure of 8 cm with EPR of 3.   12/02/2016: She reports doing well with CPAP. Weight seems to fluctuate. She has recently received supplies. She feels that sleep is still well consolidated and good quality, less daytime somnolence as compared to before CPAP therapy. She does have stressors including her husband's health and she also takes care of her son with special needs.   REVIEW OF SYSTEMS: Out of a complete 14 system review of symptoms, the patient complains only of the following symptoms, and all other reviewed systems are negative.  See HPI  ALLERGIES: Allergies  Allergen Reactions  . Penicillins     REACTION: causes face swelling    HOME MEDICATIONS: Outpatient Medications Prior to Visit  Medication Sig Dispense Refill  . aspirin EC 81 MG tablet Take 81 mg by mouth daily.    Marland Kitchen gabapentin (NEURONTIN) 300 MG capsule Take 300 mg by mouth every morning.     . metFORMIN (GLUCOPHAGE) 500 MG tablet TAKE ONE TABLET BY MOUTH THREE TIMES DAILY WITH MEALS    . metoprolol succinate (TOPROL-XL) 25 MG 24 hr tablet     . naproxen (NAPROSYN) 500 MG tablet Take 500 mg by mouth 2 (two) times daily as needed.    Marland Kitchen  quinapril-hydrochlorothiazide (ACCURETIC) 20-12.5 MG tablet Take by mouth.    Marland Kitchen atorvastatin (LIPITOR) 20 MG tablet Take 20 mg by mouth.     No facility-administered medications prior to visit.     PAST MEDICAL HISTORY: Past Medical History:  Diagnosis Date  . CHEST PAIN, ACUTE   . Depression   . DIAB W/NEURO MANIFESTS TYPE II/UNS NOT UNCNTRL   . DIABETES MELLITUS   . Esophageal reflux   . HYPERLIPIDEMIA, MILD   . HYPERTENSION   . Osteoarthritis     PAST SURGICAL HISTORY: Past Surgical History:  Procedure Laterality Date  . ABDOMINAL HYSTERECTOMY      partial  . CHOLECYSTECTOMY    . COLONOSCOPY WITH PROPOFOL N/A 08/15/2014   Procedure: COLONOSCOPY WITH PROPOFOL;  Surgeon: Garlan Fair, MD;  Location: [facility];  Service: Endoscopy;  Laterality: N/A;  . SALPINGOOPHORECTOMY Right     FAMILY HISTORY: Family History  Problem Relation Age of Onset  . Hypertension Mother   . Heart failure Father   . Prostate cancer Father   . Hypertension Father   . Cancer Paternal Aunt     SOCIAL HISTORY: Social History   Socioeconomic History  . Marital status: Married    Spouse name: Not on file  . Number of children: 1  . Years of education: 16  . Highest education level: Not on file  Social Needs  . Financial resource strain: Not on file  . Food insecurity - worry: Not on file  . Food insecurity - inability: Not on file  . Transportation needs - medical: Not on file  . Transportation needs - non-medical: Not on file  Occupational History  . Not on file  Tobacco Use  . Smoking status: Never Smoker  Substance and Sexual Activity  . Alcohol use: Yes    Alcohol/week: 0.0 oz    Comment: occassionally beer  . Drug use: No  . Sexual activity: Not on file  Other Topics Concern  . Not on file  Social History Narrative   Drinks coffee, tea, soda daily       PHYSICAL EXAM  Vitals:   12/03/17 1252  BP: 121/77  Pulse: 76  Weight: 225 lb 12.8 oz (102.4 kg)  Height: 4\' 11"  (1.499 m)   Body mass index is 45.61 kg/m.  Generalized: Well developed, in no acute distress   Neurological examination  Mentation: Alert oriented to time, place, history taking. Follows all commands speech and language fluent Cranial nerve II-XII: Pupils were equal round reactive to light. Extraocular movements were full, visual field were full on confrontational test. Facial sensation and strength were normal. Uvula tongue midline. Head turning and shoulder shrug  were normal and symmetric. Motor: The motor testing reveals 5 over 5 strength of all 4  extremities. Good symmetric motor tone is noted throughout.  Sensory: Sensory testing is intact to soft touch on all 4 extremities. No evidence of extinction is noted.  Coordination: Cerebellar testing reveals good finger-nose-finger and heel-to-shin bilaterally.  Gait and station: Gait is normal. Tandem gait is normal. Romberg is negative. No drift is seen.  Reflexes: Deep tendon reflexes are symmetric and normal bilaterally.   DIAGNOSTIC DATA (LABS, IMAGING, TESTING) - I reviewed patient records, labs, notes, testing and imaging myself where available.  Lab Results  Component Value Date   WBC 6.4 10/20/2009   HGB 12.2 10/20/2009   HCT 37.7 10/20/2009   MCV 86.2 10/20/2009   PLT 309.0 10/20/2009      Component Value  Date/Time   NA 141 10/20/2009 1136   K 4.3 10/20/2009 1136   CL 105 10/20/2009 1136   CO2 29 10/20/2009 1136   GLUCOSE 100 (H) 10/20/2009 1136   BUN 11 10/20/2009 1136   CREATININE 0.7 10/20/2009 1136   CALCIUM 9.5 10/20/2009 1136   PROT 7.3 10/20/2009 1136   ALBUMIN 3.7 10/20/2009 1136   AST 15 10/20/2009 1136   ALT 16 10/20/2009 1136   ALKPHOS 102 10/20/2009 1136   BILITOT 0.5 10/20/2009 1136   GFRNONAA 114.73 10/20/2009 1136   GFRAA 116 06/14/2008 0746   Lab Results  Component Value Date   CHOL 145 10/20/2009   HDL 44.60 10/20/2009   LDLCALC 85 10/20/2009   TRIG 77.0 10/20/2009   CHOLHDL 3 10/20/2009       ASSESSMENT AND PLAN 56 y.o. year old female  has a past medical history of CHEST PAIN, ACUTE, Depression, DIAB W/NEURO MANIFESTS TYPE II/UNS NOT UNCNTRL, DIABETES MELLITUS, Esophageal reflux, HYPERLIPIDEMIA, MILD, HYPERTENSION, and Osteoarthritis. here with:  1.  Obstructive sleep apnea on CPAP  Overall the patient is doing well.  Her CPAP download indicates good compliance and treatment of her apnea.  She is advised that if her symptoms worsen or she develops new symptoms she should let us know.  She is encouraged to continue using the CPAP  nightly.  She will follow-up in 1 year or sooner if needed.  I spent 15 minutes with the patient. 50% of this time was spent reviewing her CPAP download.     Ward Givens, MSN, NP-C 12/03/2017, 1:14 PM [facility] [address], [place], [place] [address] [phone]  I reviewed the above note and documentation by the Nurse Practitioner and agree with the history, physical exam, assessment and plan as outlined above. I was immediately available for face-to-face consultation. Star Age, MD, PhD [facility])

## 2017-12-03 NOTE — Patient Instructions (Signed)
Your Plan:  Continue using CPAP nightly If your symptoms worsen or you develop new symptoms please let us know.   Thank you for coming to see us at [facility]. I hope we have been able to provide you high quality care today.  You may receive a patient satisfaction survey over the next few weeks. We would appreciate your feedback and comments so that we may continue to improve ourselves and the health of our patients.  

## 2018-10-19 ENCOUNTER — Encounter: Payer: Self-pay | Admitting: Adult Health

## 2018-10-20 ENCOUNTER — Telehealth: Payer: Self-pay

## 2018-10-20 DIAGNOSIS — Z0289 Encounter for other administrative examinations: Secondary | ICD-10-CM

## 2018-10-20 NOTE — Telephone Encounter (Signed)
DMV form received. Completed, given to Dr. Rexene Alberts for review and signature.

## 2018-10-20 NOTE — Telephone Encounter (Signed)
Dr. Rexene Alberts reviewed, completed, and signed. Paperwork returned to MR for faxing.

## 2018-10-21 NOTE — Telephone Encounter (Signed)
I faxed pt form to [facility] on 10/21/18

## 2018-11-24 ENCOUNTER — Ambulatory Visit (INDEPENDENT_AMBULATORY_CARE_PROVIDER_SITE_OTHER): Payer: BLUE CROSS/BLUE SHIELD | Admitting: Adult Health

## 2018-11-24 ENCOUNTER — Encounter: Payer: Self-pay | Admitting: Adult Health

## 2018-11-24 VITALS — BP 101/67 | HR 65 | Ht 59.0 in | Wt 227.2 lb

## 2018-11-24 DIAGNOSIS — Z9989 Dependence on other enabling machines and devices: Secondary | ICD-10-CM | POA: Diagnosis not present

## 2018-11-24 DIAGNOSIS — G4733 Obstructive sleep apnea (adult) (pediatric): Secondary | ICD-10-CM | POA: Diagnosis not present

## 2018-11-24 NOTE — Patient Instructions (Addendum)

## 2018-11-24 NOTE — Progress Notes (Signed)
PATIENT: [name] Washington DOB: Mar 10, 1961  REASON FOR VISIT: follow up HISTORY FROM: patient  HISTORY OF PRESENT ILLNESS: Today 11/24/18:  [name] Washington is a 57 year old female with a history of obstructive sleep apnea on CPAP.  She returns today for follow-up.  Her CPAP download indicates that she use her machine 25/30 days for a compliance of 83%. She used her machine >4 hours 24 days for compliance of 80%. On average she uses her machine  6 hours and 13 minutes. Her residual AHI 2.9 on 8cmH20. She does not have a significant leak. Feels that the CPAP continues to offer her benefit.  Her Epworth sleepiness score is 17 and fatigue severity score is 35.  She reports that she has a disabled son and husband and she is a caregiver for both.  She feels that this contributes to her fatigue and sleepiness.  He returns today for evaluation.  She returns today for follow-up.   HISTORY [name] Washington is a 57 year old female with a history of obstructive sleep apnea on CPAP.  She returns today for follow-up.  Her CPAP download indicates that she used her machine 30 out of 30 days for compliance of 100%.  She use her machine greater than 4 hours 29 out of 30 days for compliance of 97%.  On average she uses her machine 6 hours and 8minutes her residual AHI is 3 on 8 cm of water with EPR of 3.  She does not have a significant leak.  She reports that the CPAP continues to work well for her.  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  She returns today for an evaluation.  REVIEW OF SYSTEMS: Out of a complete 14 system review of symptoms, the patient complains only of the following symptoms, and all other reviewed systems are negative. See HPI    ALLERGIES: Allergies  Allergen Reactions  . Penicillins     REACTION: causes face swelling    HOME MEDICATIONS: Outpatient Medications Prior to Visit  Medication Sig Dispense Refill  .  aspirin EC 81 MG tablet Take 81 mg by mouth daily.    Marland Kitchen atorvastatin (LIPITOR) 20 MG tablet Take 20 mg by mouth.    . gabapentin (NEURONTIN) 300 MG capsule Take 300 mg by mouth every morning.     . metFORMIN (GLUCOPHAGE) 500 MG tablet TAKE ONE TABLET BY MOUTH THREE TIMES DAILY WITH MEALS    . metoprolol succinate (TOPROL-XL) 25 MG 24 hr tablet     . naproxen (NAPROSYN) 500 MG tablet Take 500 mg by mouth 2 (two) times daily as needed.    . quinapril-hydrochlorothiazide (ACCURETIC) 20-12.5 MG tablet Take by mouth.     No facility-administered medications prior to visit.     PAST MEDICAL HISTORY: Past Medical History:  Diagnosis Date  . CHEST PAIN, ACUTE   . Depression   . DIAB W/NEURO MANIFESTS TYPE II/UNS NOT UNCNTRL   . DIABETES MELLITUS   . Esophageal reflux   . HYPERLIPIDEMIA, MILD   . HYPERTENSION   . Osteoarthritis     PAST SURGICAL HISTORY: Past Surgical History:  Procedure Laterality Date  . ABDOMINAL HYSTERECTOMY     partial  . CHOLECYSTECTOMY    . COLONOSCOPY WITH PROPOFOL N/A 08/15/2014   Procedure: COLONOSCOPY WITH PROPOFOL;  Surgeon: Garlan Fair, MD;  Location: [facility];  Service: Endoscopy;  Laterality: N/A;  . SALPINGOOPHORECTOMY Right  FAMILY HISTORY: Family History  Problem Relation Age of Onset  . Hypertension Mother   . Heart failure Father   . Prostate cancer Father   . Hypertension Father   . Cancer Paternal Aunt     SOCIAL HISTORY: Social History   Socioeconomic History  . Marital status: Married    Spouse name: Not on file  . Number of children: 1  . Years of education: 24  . Highest education level: Not on file  Occupational History  . Not on file  Social Needs  . Financial resource strain: Not on file  . Food insecurity:    Worry: Not on file    Inability: Not on file  . Transportation needs:    Medical: Not on file    Non-medical: Not on file  Tobacco Use  . Smoking status: Never Smoker  Substance and Sexual Activity   . Alcohol use: Yes    Alcohol/week: 0.0 standard drinks    Comment: occassionally beer  . Drug use: No  . Sexual activity: Not on file  Lifestyle  . Physical activity:    Days per week: Not on file    Minutes per session: Not on file  . Stress: Not on file  Relationships  . Social connections:    Talks on phone: Not on file    Gets together: Not on file    Attends religious service: Not on file    Active member of club or organization: Not on file    Attends meetings of clubs or organizations: Not on file    Relationship status: Not on file  . Intimate partner violence:    Fear of current or ex partner: Not on file    Emotionally abused: Not on file    Physically abused: Not on file    Forced sexual activity: Not on file  Other Topics Concern  . Not on file  Social History Narrative   Drinks coffee, tea, soda daily       PHYSICAL EXAM  Vitals:   11/24/18 1111  Height: 4\' 11"  (1.499 m)   Body mass index is 45.61 kg/m.  Generalized: Well developed, in no acute distress   Neurological examination  Mentation: Alert oriented to time, place, history taking. Follows all commands speech and language fluent Cranial nerve II-XII: Pupils were equal round reactive to light. Extraocular movements were full, visual field were full on confrontational test. Facial sensation and strength were normal. Uvula tongue midline. Head turning and shoulder shrug  were normal and symmetric. Neck circumference 17 inches, Mallampati 4+ Motor: The motor testing reveals 5 over 5 strength of all 4 extremities. Good symmetric motor tone is noted throughout.  Sensory: Sensory testing is intact to soft touch on all 4 extremities. No evidence of extinction is noted.  Coordination: Cerebellar testing reveals good finger-nose-finger and heel-to-shin bilaterally.  Gait and station: Gait is normal.   DIAGNOSTIC DATA (LABS, IMAGING, TESTING) - I reviewed patient records, labs, notes, testing and imaging  myself where available.  Lab Results  Component Value Date   WBC 6.4 10/20/2009   HGB 12.2 10/20/2009   HCT 37.7 10/20/2009   MCV 86.2 10/20/2009   PLT 309.0 10/20/2009      Component Value Date/Time   NA 141 10/20/2009 1136   K 4.3 10/20/2009 1136   CL 105 10/20/2009 1136   CO2 29 10/20/2009 1136   GLUCOSE 100 (H) 10/20/2009 1136   BUN 11 10/20/2009 1136   CREATININE 0.7 10/20/2009 1136  CALCIUM 9.5 10/20/2009 1136   PROT 7.3 10/20/2009 1136   ALBUMIN 3.7 10/20/2009 1136   AST 15 10/20/2009 1136   ALT 16 10/20/2009 1136   ALKPHOS 102 10/20/2009 1136   BILITOT 0.5 10/20/2009 1136   GFRNONAA 114.73 10/20/2009 1136   GFRAA 116 06/14/2008 0746   Lab Results  Component Value Date   CHOL 145 10/20/2009   HDL 44.60 10/20/2009   LDLCALC 85 10/20/2009   TRIG 77.0 10/20/2009   CHOLHDL 3 10/20/2009   No results found for: HGBA1C No results found for: VITAMINB12 Lab Results  Component Value Date   TSH 1.58 10/20/2009      ASSESSMENT AND PLAN 57 y.o. year old female  has a past medical history of CHEST PAIN, ACUTE, Depression, DIAB W/NEURO MANIFESTS TYPE II/UNS NOT UNCNTRL, DIABETES MELLITUS, Esophageal reflux, HYPERLIPIDEMIA, MILD, HYPERTENSION, and Osteoarthritis. here with :  1.  Obstructive sleep apnea on CPAP  The patients CPAP download indicates good compliance and treatment of her apnea.  She is encouraged to continue using her machine nightly and greater than 4 hours each night.  If her symptoms worsen or she develops new symptoms she should let us know.  She will follow-up in 1 year or sooner if needed.   I spent 15 minutes with the patient. 50% of this time was spent reviewing CPAP download   Ward Givens, MSN, NP-C 11/24/2018, 11:11 AM [facility] [address] [place], [place] [address] [phone]

## 2019-11-30 ENCOUNTER — Ambulatory Visit: Payer: BLUE CROSS/BLUE SHIELD | Admitting: Adult Health

## 2019-11-30 ENCOUNTER — Encounter: Payer: Self-pay | Admitting: Adult Health

## 2019-11-30 ENCOUNTER — Telehealth: Payer: Self-pay

## 2019-11-30 NOTE — Telephone Encounter (Signed)
Patient was a no call/no show for their appointment today.   

## 2020-05-16 ENCOUNTER — Ambulatory Visit: Payer: BC Managed Care – PPO | Attending: Internal Medicine

## 2020-05-16 DIAGNOSIS — Z23 Encounter for immunization: Secondary | ICD-10-CM

## 2020-05-16 NOTE — Progress Notes (Signed)
   Covid-19 Vaccination Clinic  Name:  [name] Washington    MRN: [id number] DOB: 1961/04/09  05/16/2020  [name] was observed post Covid-19 immunization for 15 minutes without incident. She was provided with Vaccine Information Sheet and instruction to access the V-Safe system.   [name] was instructed to call 911 with any severe reactions post vaccine: Marland Kitchen Difficulty breathing  . Swelling of face and throat  . A fast heartbeat  . A bad rash all over body  . Dizziness and weakness   Immunizations Administered    Name Date Dose VIS Date Route   Pfizer COVID-19 Vaccine 05/16/2020  4:44 PM 0.3 mL 02/09/2019 Intramuscular   Manufacturer: [employer], [employer]   Lot: [id number]   [place]: [id number]

## 2021-09-13 ENCOUNTER — Ambulatory Visit: Payer: Self-pay | Admitting: Internal Medicine

## 2021-09-14 ENCOUNTER — Ambulatory Visit: Payer: Self-pay | Admitting: Nurse Practitioner

## 2021-09-20 ENCOUNTER — Other Ambulatory Visit: Payer: Self-pay

## 2021-09-21 ENCOUNTER — Ambulatory Visit (INDEPENDENT_AMBULATORY_CARE_PROVIDER_SITE_OTHER): Payer: 59 | Admitting: Nurse Practitioner

## 2021-09-21 ENCOUNTER — Encounter: Payer: Self-pay | Admitting: Nurse Practitioner

## 2021-09-21 VITALS — BP 128/86 | HR 64 | Temp 97.9°F | Ht 59.0 in | Wt 230.0 lb

## 2021-09-21 DIAGNOSIS — Z6841 Body Mass Index (BMI) 40.0 and over, adult: Secondary | ICD-10-CM

## 2021-09-21 DIAGNOSIS — M7989 Other specified soft tissue disorders: Secondary | ICD-10-CM

## 2021-09-21 DIAGNOSIS — Z13220 Encounter for screening for lipoid disorders: Secondary | ICD-10-CM | POA: Diagnosis not present

## 2021-09-21 DIAGNOSIS — E1165 Type 2 diabetes mellitus with hyperglycemia: Secondary | ICD-10-CM

## 2021-09-21 DIAGNOSIS — R002 Palpitations: Secondary | ICD-10-CM

## 2021-09-21 DIAGNOSIS — Z1329 Encounter for screening for other suspected endocrine disorder: Secondary | ICD-10-CM

## 2021-09-21 DIAGNOSIS — R079 Chest pain, unspecified: Secondary | ICD-10-CM

## 2021-09-21 LAB — COMPREHENSIVE METABOLIC PANEL
ALT: 25 U/L (ref 0–35)
AST: 18 U/L (ref 0–37)
Albumin: 4.2 g/dL (ref 3.5–5.2)
Alkaline Phosphatase: 111 U/L (ref 39–117)
BUN: 13 mg/dL (ref 6–23)
CO2: 30 mEq/L (ref 19–32)
Calcium: 10 mg/dL (ref 8.4–10.5)
Chloride: 101 mEq/L (ref 96–112)
Creatinine, Ser: 0.67 mg/dL (ref 0.40–1.20)
GFR: 95.16 mL/min (ref >60.00)
Glucose, Bld: 110 mg/dL — ABNORMAL HIGH (ref 70–99)
Potassium: 3.8 mEq/L (ref 3.5–5.1)
Sodium: 139 mEq/L (ref 135–145)
Total Bilirubin: 0.3 mg/dL (ref 0.2–1.2)
Total Protein: 7.7 g/dL (ref 6.0–8.3)

## 2021-09-21 LAB — LIPID PANEL
Cholesterol: 139 mg/dL (ref 0–200)
HDL: 49.4 mg/dL (ref >39.00)
LDL Cholesterol: 64 mg/dL (ref 0–99)
NonHDL: 89.19
Total CHOL/HDL Ratio: 3
Triglycerides: 128 mg/dL (ref 0.0–149.0)
VLDL: 25.6 mg/dL (ref 0.0–40.0)

## 2021-09-21 LAB — HEMOGLOBIN A1C: Hgb A1c MFr Bld: 7.8 % — ABNORMAL HIGH (ref 4.6–6.5)

## 2021-09-21 LAB — T3, FREE: T3, Free: 3.2 pg/mL (ref 2.3–4.2)

## 2021-09-21 LAB — CBC WITH DIFFERENTIAL/PLATELET
Basophils Absolute: 0 10*3/uL (ref 0.0–0.1)
Basophils Relative: 0.4 % (ref 0.0–3.0)
Eosinophils Absolute: 0.1 10*3/uL (ref 0.0–0.7)
Eosinophils Relative: 2 % (ref 0.0–5.0)
HCT: 35.8 % — ABNORMAL LOW (ref 36.0–46.0)
Hemoglobin: 11.7 g/dL — ABNORMAL LOW (ref 12.0–15.0)
Lymphocytes Relative: 40 % (ref 12.0–46.0)
Lymphs Abs: 2.4 10*3/uL (ref 0.7–4.0)
MCHC: 32.8 g/dL (ref 30.0–36.0)
MCV: 84.3 fl (ref 78.0–100.0)
Monocytes Absolute: 0.5 10*3/uL (ref 0.1–1.0)
Monocytes Relative: 9 % (ref 3.0–12.0)
Neutro Abs: 2.9 10*3/uL (ref 1.4–7.7)
Neutrophils Relative %: 48.6 % (ref 43.0–77.0)
Platelets: 304 10*3/uL (ref 150.0–400.0)
RBC: 4.24 Mil/uL (ref 3.87–5.11)
RDW: 15.8 % — ABNORMAL HIGH (ref 11.5–15.5)
WBC: 6 10*3/uL (ref 4.0–10.5)

## 2021-09-21 LAB — T4, FREE: Free T4: 0.84 ng/dL (ref 0.60–1.60)

## 2021-09-21 LAB — TSH: TSH: 2.14 u[IU]/mL (ref 0.35–5.50)

## 2021-09-21 NOTE — Progress Notes (Signed)
Subjective:  Patient ID: [name] Washington, female    DOB: Apr 25, 1961  Age: 60 y.o. MRN: [id number]  CC:  Chief Complaint  Patient presents with   New Patient (Initial Visit)   Chest Pain      HPI  This patient arrives today for the above.  She recently changed insurance and previous PCP is no longer covered under her insurance. She is here to establish care today. Main complaint today is chest pain and fatigue x 2 months. Triggers are heavy lifting, she describes pain as a "hurting" denies aching, stabbing, or soreness. The pain will usually occur a day after heavy lifting. Denies SOB, has noticed more fatigue and bilateral lower extremity swelling.  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    DIABETES MELLITUS    Esophageal reflux    HYPERLIPIDEMIA, MILD    HYPERTENSION    Osteoarthritis       Family History  Problem Relation Age of Onset   Hypertension Mother    Heart failure Father    Prostate cancer Father    Hypertension Father    Cancer Paternal Aunt     Social History   Social History Narrative   Drinks coffee, tea, soda daily    Social History   Tobacco Use   Smoking status: Never   Smokeless tobacco: Never  Substance Use Topics   Alcohol use: Yes    Alcohol/week: 0.0 standard drinks    Comment: occassionally beer     Current Meds  Medication Sig   aspirin EC 81 MG tablet Take 81 mg by mouth daily.   gabapentin (NEURONTIN) 300 MG capsule Take 300 mg by mouth every morning.    glimepiride (AMARYL) 4 MG tablet Take 4 mg by mouth daily with breakfast.   metFORMIN (GLUCOPHAGE) 500 MG tablet TAKE ONE TABLET BY MOUTH THREE TIMES DAILY WITH MEALS   metoprolol succinate (TOPROL-XL) 25 MG 24 hr tablet    naproxen (NAPROSYN) 500 MG tablet Take 500 mg by mouth 2 (two) times daily as needed.   quinapril-hydrochlorothiazide (ACCURETIC) 20-12.5 MG tablet Take by mouth.     ROS:  Review of Systems  Constitutional:  Positive for malaise/fatigue.  Respiratory:  Negative for shortness of breath.   Cardiovascular:  Positive for chest pain (8/10 intermittent; usually occurs a day after any heavy lifting.), palpitations and leg swelling. Negative for orthopnea.  Neurological:  Negative for dizziness and headaches.    Objective:   Today's Vitals: BP 128/86 (BP Location: Right Arm, Patient Position: Sitting, Cuff Size: Large)   Pulse 64   Temp 97.9 F (36.6 C) (Oral)   Ht 4\' 11"  (1.499 m)   Wt 230 lb (104.3 kg)   SpO2 97%   BMI 46.45 kg/m  Vitals with BMI 09/21/2021 11/24/2018 12/03/2017  Height 4\' 11"  4\' 11"  4\' 11"   Weight 230 lbs 227 lbs 3 oz 225 lbs 13 oz  BMI 46.43 24.40 10.27  Systolic 253 664 403  Diastolic 86 67 77  Pulse 64 65 76     Physical Exam Vitals reviewed.  Constitutional:      General: She is not in acute distress.    Appearance: Normal appearance.  HENT:     Head: Normocephalic and atraumatic.  Neck:     Vascular: No carotid bruit.  Cardiovascular:     Rate and Rhythm: Normal rate and regular  rhythm.     Pulses: Normal pulses.     Heart sounds: Normal heart sounds.  Pulmonary:     Effort: Pulmonary effort is normal.     Breath sounds: Normal breath sounds.  Musculoskeletal:     Right lower leg: Edema present.     Left lower leg: Edema present.  Skin:    General: Skin is warm and dry.  Neurological:     General: No focal deficit present.     Mental Status: She is alert and oriented to person, place, and time.  Psychiatric:        Mood and Affect: Mood normal.        Behavior: Behavior normal.        Judgment: Judgment normal.      EKG: NSR no st segment elevation noted   Assessment and Plan   1. Class 3 severe obesity with serious comorbidity and body mass index (BMI) of 45.0 to 49.9 in adult, unspecified obesity type (HCC)   2. Chest pain, unspecified type   3. Controlled type 2 diabetes mellitus with  hyperglycemia, without long-term current use of insulin ([place])   4. Screening, lipid   5. Screening for thyroid disorder   6. Palpitations   7. Swelling of lower extremity      Plan: 1.-7.  We will check blood work today for further evaluation.  We will also refer patient to cardiology for further assistance with evaluating for chest pain.   Tests ordered Orders Placed This Encounter  Procedures   TSH   Hemoglobin A1c   Lipid panel   Comprehensive metabolic panel   CBC with Differential/Platelet   T3, free   T4, free   Ambulatory referral to Cardiology   EKG 12-Lead      No orders of the defined types were placed in this encounter.   Patient to follow-up in 3 months or sooner as needed.  Ailene Ards, NP

## 2021-11-05 ENCOUNTER — Other Ambulatory Visit: Payer: Self-pay

## 2021-11-05 ENCOUNTER — Ambulatory Visit (INDEPENDENT_AMBULATORY_CARE_PROVIDER_SITE_OTHER): Payer: 59 | Admitting: Interventional Cardiology

## 2021-11-05 ENCOUNTER — Encounter: Payer: Self-pay | Admitting: Interventional Cardiology

## 2021-11-05 VITALS — BP 128/82 | HR 78 | Ht 59.0 in | Wt 227.0 lb

## 2021-11-05 DIAGNOSIS — R2242 Localized swelling, mass and lump, left lower limb: Secondary | ICD-10-CM

## 2021-11-05 DIAGNOSIS — E119 Type 2 diabetes mellitus without complications: Secondary | ICD-10-CM

## 2021-11-05 DIAGNOSIS — E782 Mixed hyperlipidemia: Secondary | ICD-10-CM | POA: Diagnosis not present

## 2021-11-05 DIAGNOSIS — R072 Precordial pain: Secondary | ICD-10-CM

## 2021-11-05 DIAGNOSIS — I10 Essential (primary) hypertension: Secondary | ICD-10-CM | POA: Diagnosis not present

## 2021-11-05 MED ORDER — METOPROLOL TARTRATE 100 MG PO TABS: ORAL_TABLET | ORAL | 0 refills | Status: DC

## 2021-11-05 NOTE — Progress Notes (Signed)
Cardiology Office Note   Date:  11/05/2021   ID:  RYLYN ZAWISTOWSKI, DOB 09/09/1961, MRN [id number]  PCP:  Ailene Ards, NP    No chief complaint on file.  Chest pain  Wt Readings from Last 3 Encounters:  11/05/21 227 lb (103 kg)  09/21/21 230 lb (104.3 kg)  11/24/18 227 lb 3.2 oz (103.1 kg)       History of Present Illness: [name] Washington is a 60 y.o. female who is being seen today for the evaluation of chest pain at the request of Ailene Ards, NP.   Records show: "Main complaint today is chest pain and fatigue x 2 months. Triggers are heavy lifting, she describes pain as a "hurting" denies aching, stabbing, or soreness. The pain will usually occur a day after heavy lifting... has noticed more fatigue and bilateral lower extremity swelling."  She has had chest pain in the past that resolved with naprosyn.    She has decreased her activity a little and this has led to a decrease in sx.  Father had heart surgery.  No siblings with CAD.   Not walking for exercise anymore. More fatigued.  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   Denies : Dizziness.Nitroglycerin use. Orthopnea. Palpitations. Paroxysmal nocturnal dyspnea. Syncope.      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    DIABETES MELLITUS    Esophageal reflux    HYPERLIPIDEMIA, MILD    HYPERTENSION    Osteoarthritis     Past Surgical History:  Procedure Laterality Date   ABDOMINAL HYSTERECTOMY     partial   CHOLECYSTECTOMY     COLONOSCOPY WITH PROPOFOL N/A 08/15/2014   Procedure: COLONOSCOPY WITH PROPOFOL;  Surgeon: Garlan Fair, MD;  Location: [facility];  Service: Endoscopy;  Laterality: N/A;   SALPINGOOPHORECTOMY Right      Current Outpatient Medications  Medication Sig Dispense Refill   aspirin EC 81 MG tablet Take 81 mg by mouth daily.     furosemide (LASIX) 20 MG tablet Take 20 mg by  mouth daily.     gabapentin (NEURONTIN) 300 MG capsule Take 300 mg by mouth every morning.      glimepiride (AMARYL) 4 MG tablet Take 4 mg by mouth daily with breakfast.     metFORMIN (GLUCOPHAGE) 500 MG tablet TAKE ONE TABLET BY MOUTH THREE TIMES DAILY WITH MEALS     metoprolol succinate (TOPROL-XL) 25 MG 24 hr tablet      quinapril-hydrochlorothiazide (ACCURETIC) 20-12.5 MG tablet Take by mouth.     atorvastatin (LIPITOR) 20 MG tablet Take 20 mg by mouth.     naproxen (NAPROSYN) 500 MG tablet Take 500 mg by mouth 2 (two) times daily as needed. (Patient not taking: Reported on 11/05/2021)     No current facility-administered medications for this visit.    Allergies:   Penicillins    Social History:  The patient  reports that she has never smoked. She has never used smokeless tobacco. She reports current alcohol use. She reports that she does not use drugs.   Family History:  The patient's \\family  history includes Cancer in her paternal aunt; Heart failure in her father; Hypertension in her father and mother; Prostate cancer in her father.    ROS:  Please see the history of present illness.   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   All other systems are reviewed and negative.    PHYSICAL EXAM: VS:  BP 128/82   Pulse 78   Ht 4\' 11"  (1.499 m)   Wt 227 lb (103 kg)   SpO2 99%   BMI 45.85 kg/m  , BMI Body mass index is 45.85 kg/m. GEN: Well nourished, well developed, in no acute distress HEENT: normal Neck: no JVD, carotid bruits, or masses Cardiac: RRR; no murmurs, rubs, or gallops, bilateral LE/ankle edema  Respiratory:  clear to auscultation bilaterally, normal work of breathing GI: soft, nontender, nondistended, + BS MS: no deformity or atrophy Skin: warm and dry, no rash Neuro:  Strength and sensation are intact Psych: euthymic mood, full affect   EKG:   The ekg ordered 09/21/21 demonstrates NSR, no ST changes   Recent  Labs: 09/21/2021: ALT 25; BUN 13; Creatinine, Ser 0.67; Hemoglobin 11.7; Platelets 304.0; Potassium 3.8; Sodium 139; TSH 2.14   Lipid Panel    Component Value Date/Time   CHOL 139 09/21/2021 1105   TRIG 128.0 09/21/2021 1105   HDL 49.40 09/21/2021 1105   CHOLHDL 3 09/21/2021 1105   VLDL 25.6 09/21/2021 1105   LDLCALC 64 09/21/2021 1105     Other studies Reviewed: Additional studies/ records that were reviewed today with results demonstrating: 2009 stress test with breast attenuation.   ASSESSMENT AND PLAN:  Chest pain: Worse within the last year.  Feels like a burning sensation.  Plan for CTA coronaries. Metoprolol 100 mg x 1. DOE:  Fatigue has been increasing. Plan for echo to eval LVEF.  HTN: The current medical regimen is effective;  continue present plan and medications. Hyperlipidemia: Diet has not been good of late with lot of junk food.  Continue atorvastatin.  Type 2 DM: A1C 7.8 in 2022.   LE swelling: Worse when she is on her feet.  Elevate legs. Persistent despite furosemide added.  Could stop HCTZ and increase furosemide. OSA: Using CPAP.    Anemia: 11.7 in 09/2021.     Current medicines are reviewed at length with the patient today.  The patient concerns regarding her medicines were addressed.  The following changes have been made:  No change  Labs/ tests ordered today include: CTA coronaries, echo No orders of the defined types were placed in this encounter.   Recommend 150 minutes/week of aerobic exercise Low fat, low carb, high fiber diet recommended  Disposition:   FU for tests   Signed, Larae Grooms, MD  11/05/2021 4:04 PM    [facility] [place], [place], [place]  [address] Phone: [phone]; Fax: [phone]

## 2021-11-05 NOTE — Patient Instructions (Signed)
Medication Instructions:  Your physician recommends that you continue on your current medications as directed. Please refer to the Current Medication list given to you today.  *If you need a refill on your cardiac medications before your next appointment, please call your pharmacy*   Lab Work: Lab work to be done today--BMP If you have labs (blood work) drawn today and your tests are completely normal, you will receive your results only by: [place] (if you have MyChart) OR A paper copy in the mail If you have any lab test that is abnormal or we need to change your treatment, we will call you to review the results.   Testing/Procedures: Your physician has requested that you have an echocardiogram. Echocardiography is a painless test that uses sound waves to create images of your heart. It provides your doctor with information about the size and shape of your heart and how well your heart's chambers and valves are working. This procedure takes approximately one hour. There are no restrictions for this procedure.  Your physician has requested that you have cardiac CT. Cardiac computed tomography (CT) is a painless test that uses an x-ray machine to take clear, detailed pictures of your heart. For further information please visit [email or web address]. Please follow instruction sheet as given.     Follow-Up: At [facility], you and your health needs are our priority.  As part of our continuing mission to provide you with exceptional heart care, we have created designated Provider Care Teams.  These Care Teams include your primary Cardiologist (physician) and Advanced Practice Providers (APPs -  Physician Assistants and Nurse Practitioners) who all work together to provide you with the care you need, when you need it.  We recommend signing up for the patient portal called "MyChart".  Sign up information is provided on this After Visit Summary.  MyChart is used to connect with patients  for Virtual Visits (Telemedicine).  Patients are able to view lab/test results, encounter notes, upcoming appointments, etc.  Non-urgent messages can be sent to your provider as well.   To learn more about what you can do with MyChart, go to [email or web address].    Your next appointment:   Based on test results  The format for your next appointment:   In Person  Provider:   Larae Grooms, MD     Other Instructions   Your cardiac CT will be scheduled at one of the below locations:   [facility] [address] [place], [place] [address] [phone]  [facility] [address] [place], [place] [address] [phone]  If scheduled at [facility], please arrive at the [facility] main entrance (entrance A) of [facility] 30 minutes prior to test start time. You can use the FREE valet parking offered at the main entrance (encouraged to control the heart rate for the test) Proceed to the [facility] Radiology Department (first floor) to check-in and test prep.  If scheduled at [facility], please arrive 15 mins early for check-in and test prep.    Hold all erectile dysfunction medications at least 3 days (72 hrs) prior to test.  On the Night Before the Test: Be sure to Drink plenty of water. Do not consume any caffeinated/decaffeinated beverages or chocolate 12 hours prior to your test. Do not take any antihistamines 12 hours prior to your test.  On the Day of the Test: Drink plenty of water until 1 hour  prior to the test. Do not eat any food 4 hours prior to the test. You may take your regular medications prior to the test.  Take metoprolol (Lopressor) two hours prior to test. HOLD Furosemide/Hydrochlorothiazide morning of the test. FEMALES- please wear underwire-free bra if available, avoid dresses & tight clothing         After the Test: Drink plenty of  water. After receiving IV contrast, you may experience a mild flushed feeling. This is normal. On occasion, you may experience a mild rash up to 24 hours after the test. This is not dangerous. If this occurs, you can take Benadryl 25 mg and increase your fluid intake. If you experience trouble breathing, this can be serious. If it is severe call 911 IMMEDIATELY. If it is mild, please call our office. If you take any of these medications: Glipizide/Metformin, Avandament, Glucavance, please do not take 48 hours after completing test unless otherwise instructed.  Please allow 2-4 weeks for scheduling of routine cardiac CTs. Some insurance companies require a pre-authorization which may delay scheduling of this test.   For non-scheduling related questions, please contact the cardiac imaging nurse navigator should you have any questions/concerns: Marchia Bond, Cardiac Imaging Nurse Navigator Gordy Clement, Cardiac Imaging Nurse Navigator [facility] Direct Office Dial: [phone]   For scheduling needs, including cancellations and rescheduling, please call [place], [phone].

## 2021-11-06 LAB — BASIC METABOLIC PANEL
BUN/Creatinine Ratio: 15 (ref 12–28)
BUN: 13 mg/dL (ref 8–27)
CO2: 25 mmol/L (ref 20–29)
Calcium: 9.8 mg/dL (ref 8.7–10.3)
Chloride: 100 mmol/L (ref 96–106)
Creatinine, Ser: 0.84 mg/dL (ref 0.57–1.00)
Glucose: 76 mg/dL (ref 70–99)
Potassium: 4 mmol/L (ref 3.5–5.2)
Sodium: 138 mmol/L (ref 134–144)
eGFR: 80 mL/min/{1.73_m2} (ref >59)

## 2021-11-19 ENCOUNTER — Telehealth (HOSPITAL_COMMUNITY): Payer: Self-pay | Admitting: Emergency Medicine

## 2021-11-19 NOTE — Telephone Encounter (Signed)
Reaching out to patient to offer assistance regarding upcoming cardiac imaging study; pt verbalizes understanding of appt date/time, parking situation and where to check in, pre-test NPO status and medications ordered, and verified current allergies; name and call back number provided for further questions should they arise Marchia Bond RN Navigator Cardiac Imaging [facility] [phone] office [phone] cell  Arrival 830 100mg  metoprolol tart (holding toprol xl, diuretics) Denies iv issues

## 2021-11-20 ENCOUNTER — Ambulatory Visit (HOSPITAL_COMMUNITY)
Admission: RE | Admit: 2021-11-20 | Discharge: 2021-11-20 | Disposition: A | Discharge status: Home / Self Care | Payer: 59 | Source: Ambulatory Visit | Attending: Interventional Cardiology | Admitting: Interventional Cardiology

## 2021-11-20 ENCOUNTER — Other Ambulatory Visit: Payer: Self-pay | Admitting: Cardiovascular Disease

## 2021-11-20 ENCOUNTER — Other Ambulatory Visit: Payer: Self-pay

## 2021-11-20 ENCOUNTER — Ambulatory Visit (HOSPITAL_COMMUNITY)
Admission: RE | Admit: 2021-11-20 | Discharge: 2021-11-20 | Disposition: A | Discharge status: Home / Self Care | Payer: 59 | Source: Ambulatory Visit | Attending: Cardiovascular Disease | Admitting: Cardiovascular Disease

## 2021-11-20 DIAGNOSIS — R072 Precordial pain: Secondary | ICD-10-CM | POA: Diagnosis present

## 2021-11-20 DIAGNOSIS — I2510 Atherosclerotic heart disease of native coronary artery without angina pectoris: Secondary | ICD-10-CM | POA: Insufficient documentation

## 2021-11-20 IMAGING — CT CT HEART MORP W/ CTA COR W/ SCORE W/ CA W/CM &/OR W/O CM
4 of 7 series · 8 of 20 positions shown, 9 images · non-contrast
Comparison: None.
COMPARISON: None.

Addendum:
EXAM:
OVER-READ INTERPRETATION  CT CHEST

The following report is an over-read performed by radiologist Dr.
Ann-Torill Moe [[email or web address]] on 11/20/2021. This
over-read does not include interpretation of cardiac or coronary
anatomy or pathology. The coronary calcium score/coronary CTA
interpretation by the cardiologist is attached.
CLINICAL DATA: 60F with diabetes, hypertension, hyperlipidemia and
chest pain.
Cardiac/Coronary  CT
TECHNIQUE: The patient was scanned on a [employer] Force scanner.

[Series 6: ts diast sharp · axial · 0.39mm/px · z∈[+1110,+1147]mm · 2 of 281 slices shown]
[im 94/281  lung]
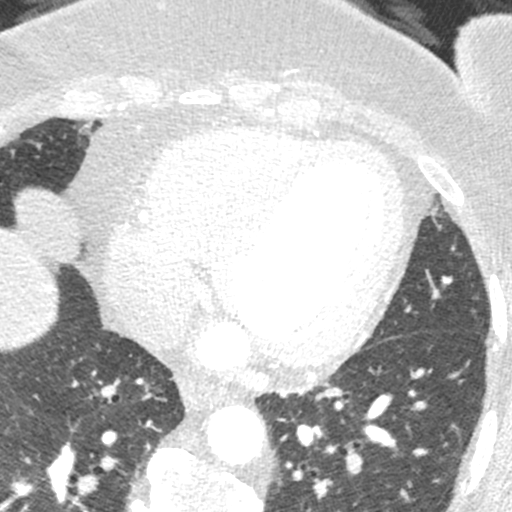
[im 187/281  lung]
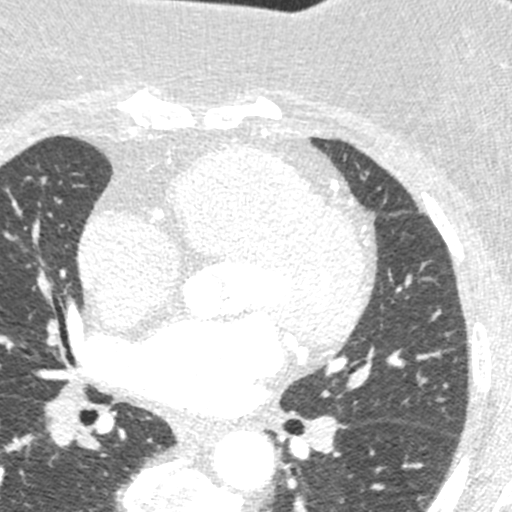

[Series 7: ts syst sharp · axial · 0.39mm/px · z∈[+1110,+1147]mm · 2 of 281 slices shown]
[im 94/281  lung]
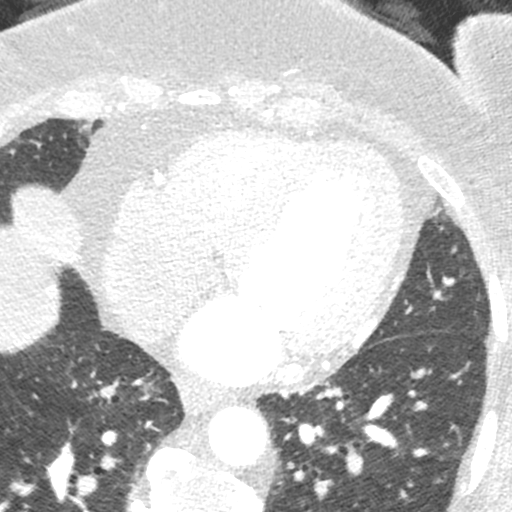
[im 187/281  lung]
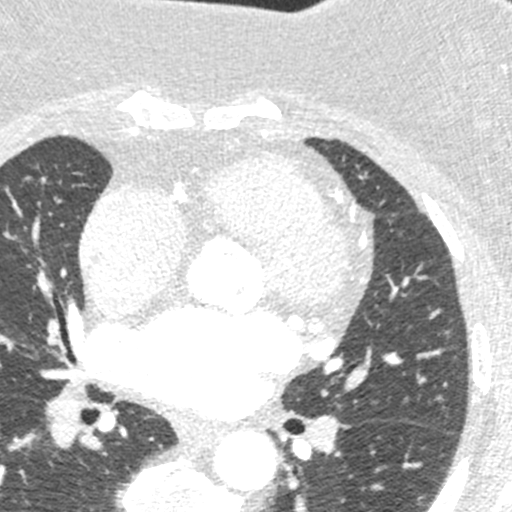

[Series 8: best syst · axial · 0.39mm/px · z∈[+1110,+1147]mm · 2 of 281 slices shown, 3 images]
[im 94/281  vessel]
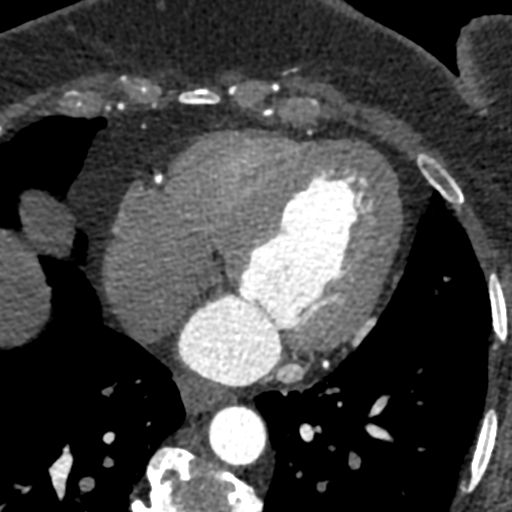
[im 94/281  lung]
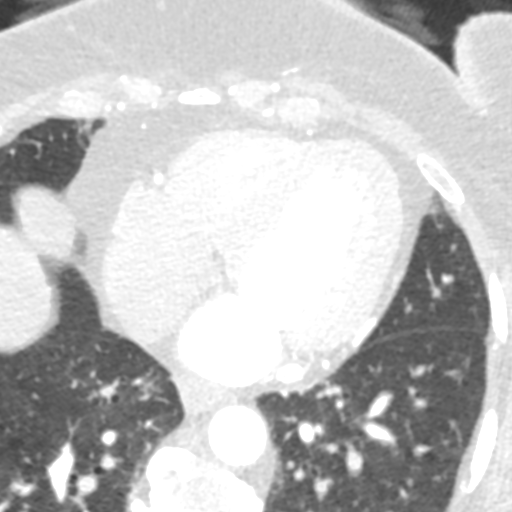
[im 187/281  vessel]
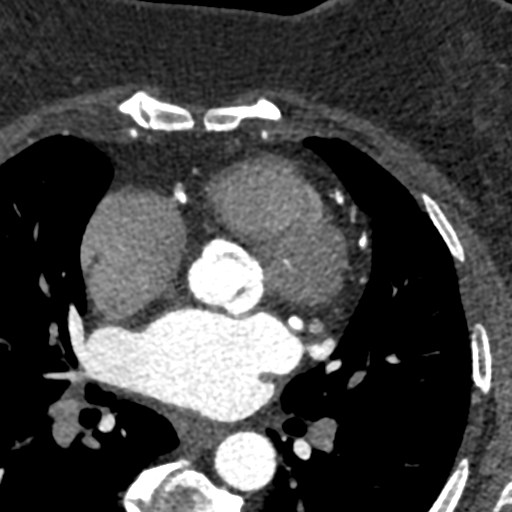

[Series 9: best diast · axial · 0.39mm/px · z∈[+1110,+1147]mm · 2 of 281 slices shown]
[im 94/281  vessel]
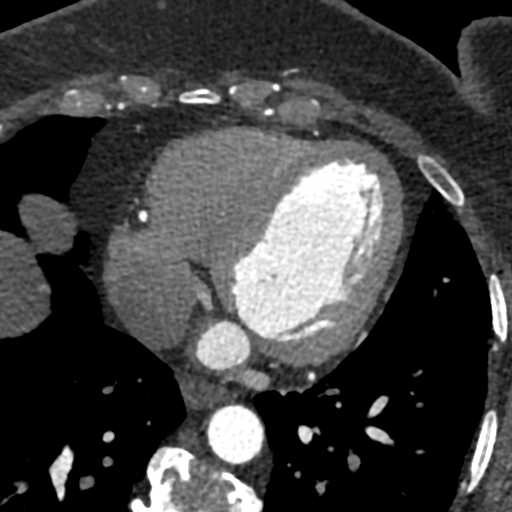
[im 187/281  vessel]
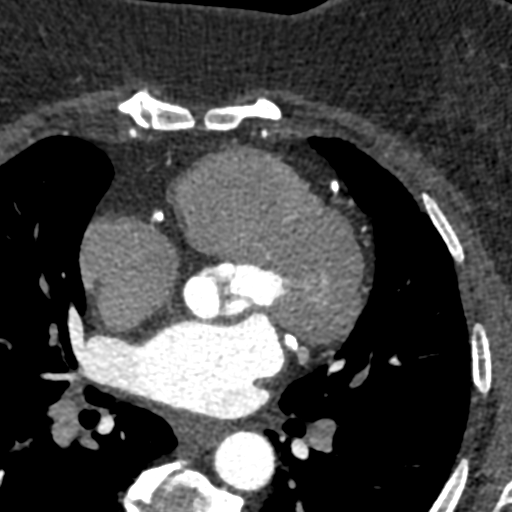

[8 of 20 positions shown; findings below may reference images not displayed]

FINDINGS: Atherosclerotic calcifications in the thoracic aorta. Within the
visualized portions of the thorax there are no suspicious appearing
pulmonary nodules or masses, there is no acute consolidative
airspace disease, no pleural effusions, no pneumothorax and no
lymphadenopathy. Visualized portions of the upper abdomen
demonstrates diffuse low attenuation throughout the visualized
hepatic parenchyma, indicative of hepatic steatosis. There are no
aggressive appearing lytic or blastic lesions noted in the
visualized portions of the skeleton.
IMPRESSION: 1.  Aortic Atherosclerosis (DR4EY-PSK.K).
2. Hepatic steatosis.
FINDINGS: A 120 kV prospective scan was triggered in the descending thoracic
aorta at 111 HU's. Axial non-contrast 3 mm slices were carried out
through the heart. The data set was analyzed on a dedicated work
station and scored using the Agatson method. Gantry rotation speed
was 250 msecs and collimation was .6 mm. No beta blockade and 0.8 mg
of sl NTG was given. The 3D data set was reconstructed in 5%
intervals of the 67-82 % of the R-R cycle. Diastolic phases were
analyzed on a dedicated work station using MPR, MIP and VRT modes.
The patient received 80 cc of contrast.

Aorta: Normal size.  Aortic atherosclerosis.  No dissection.

Aortic Valve:  Trileaflet.  No calcifications.

Coronary Arteries:  Normal coronary origin.  Right dominance.

RCA is a large dominant artery that gives rise to PDA and PLVB.
There is diffuse mild-moderate calcified plaque.

Left main is a large artery that gives rise to LAD and LCX arteries.

LAD is a large vessel that has mild (25-49%) calcified plaque
proximally. The LAD is diffusely calcified. There are two regions of
severe (>70%) calcified plaque in the mid LAD. There is a tiny D1
and D2 has mild (25-49%) calcified plaque.

LCX is a non-dominant artery that gives rise to one large OM1
branch. There is minimal (<25%) calcified plaque int he proximal
LCX. OM1 has mild (25-49%) calcified plaque proximally and moderate
(50-69%) plaque in the mid vessel. The LCX is tiny distal to OM1.

Coronary Calcium Score:

Left main: 0

Left anterior descending artery: 4925

Left circumflex artery: 563

Right coronary artery: 7047

Total: 8348

Percentile: 99th

Other findings:

Normal pulmonary vein drainage into the left atrium.

Normal let atrial appendage without a thrombus.

Normal size of the pulmonary artery.
IMPRESSION: 1. Coronary calcium score of 8348. This was 99th percentile for
age-, race-, and sex-matched controls.

2. Normal coronary origin with right dominance.

3. There is severe (>70%) plaque in the mid LAD. There is mild
(25-49%) plaque in D2 and moderate (50-69%) plaque in OM1. CAD-RADS
4.

4.  Will send study for FFRct.

5.  Aortic atherosclerosis.

*** End of Addendum ***
EXAM:
OVER-READ INTERPRETATION  CT CHEST

The following report is an over-read performed by radiologist Dr.
Ann-Torill Moe [[email or web address]] on 11/20/2021. This
over-read does not include interpretation of cardiac or coronary
anatomy or pathology. The coronary calcium score/coronary CTA
interpretation by the cardiologist is attached.
FINDINGS: Atherosclerotic calcifications in the thoracic aorta. Within the
visualized portions of the thorax there are no suspicious appearing
pulmonary nodules or masses, there is no acute consolidative
airspace disease, no pleural effusions, no pneumothorax and no
lymphadenopathy. Visualized portions of the upper abdomen
demonstrates diffuse low attenuation throughout the visualized
hepatic parenchyma, indicative of hepatic steatosis. There are no
aggressive appearing lytic or blastic lesions noted in the
visualized portions of the skeleton.
IMPRESSION: 1.  Aortic Atherosclerosis (DR4EY-PSK.K).
2. Hepatic steatosis.

## 2021-11-20 MED ORDER — IOHEXOL 350 MG/ML SOLN
95.0000 mL | Freq: Once | INTRAVENOUS | Status: AC | PRN
  Administered 2021-11-20: 95 mL via INTRAVENOUS

## 2021-11-20 MED ORDER — NITROGLYCERIN 0.4 MG SL SUBL
SUBLINGUAL_TABLET | SUBLINGUAL | Status: AC
  Filled 2021-11-20: qty 2

## 2021-11-20 MED ORDER — NITROGLYCERIN 0.4 MG SL SUBL
0.8000 mg | SUBLINGUAL_TABLET | Freq: Once | SUBLINGUAL | Status: AC
  Administered 2021-11-20: 0.8 mg via SUBLINGUAL

## 2021-11-23 ENCOUNTER — Telehealth: Payer: Self-pay | Admitting: *Deleted

## 2021-11-23 MED ORDER — ISOSORBIDE MONONITRATE ER 30 MG PO TB24: 30.0000 mg | ORAL_TABLET | Freq: Every day | ORAL | 3 refills | Status: DC

## 2021-11-23 MED ORDER — NITROGLYCERIN 0.4 MG SL SUBL: 0.4000 mg | SUBLINGUAL_TABLET | SUBLINGUAL | 6 refills | Status: AC | PRN

## 2021-11-23 NOTE — Telephone Encounter (Signed)
Dr Irish Lack would like to see patient in the office on 12/12 after echo.  I spoke with patient and reviewed results/recommendations with her.  She is having chest pain at times.  Had episode earlier today.  Prescriptions sent to [facility] on [facility] [address] [facility].  Use of SL NTG reviewed with patient. Patient will see Dr Irish Lack on 12/12 after echo

## 2021-11-23 NOTE — Telephone Encounter (Signed)
-----   Message from Jettie Booze, MD sent at 11/23/2021  5:34 PM EST ----- Start isosorbide 30 mg daily.  Can call in SL NTG. Appears to be a significant lesion in the mid LAD.  If sx not improved, would have to consider cardiac cath.   Await echo.  If chest discomfort persists, would need cardiac cath.  Need f/u phone visit in a week.

## 2021-11-25 NOTE — Progress Notes (Signed)
Cardiology Office Note   Date:  11/26/2021   ID:  [name] Washington, DOB Feb 25, 1961, MRN [id number]  PCP:  Ailene Ards, NP    No chief complaint on file.  CAD  Wt Readings from Last 3 Encounters:  11/26/21 228 lb (103.4 kg)  11/05/21 227 lb (103 kg)  09/21/21 230 lb (104.3 kg)       History of Present Illness: [name] Washington is a 60 y.o. female   who is being seen today for the evaluation of chest pain at the request of Ailene Ards, NP.    Records show: "Main complaint today is chest pain and fatigue x 2 months. Triggers are heavy lifting, she describes pain as a "hurting" denies aching, stabbing, or soreness. The pain will usually occur a day after heavy lifting... has noticed more fatigue and bilateral lower extremity swelling."   She has had chest pain in the past that resolved with naprosyn.     She has decreased her activity a little and this has led to a decrease in sx.   Father had heart surgery.  No siblings with CAD.  Coronary CTA in 11/2021 showed severe mid LAD disease.  Imdur was prescribed but has not yet been started.  She was concerned about side effects.  She is willing to start the medicine now.  Denies : Dizziness. Leg edema. Nitroglycerin use. Orthopnea. Palpitations. Paroxysmal nocturnal dyspnea. Shortness of breath. Syncope.    Has some chest pain on occasion.  It is not daily.  Sometimes it is worse with exertion.     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    DIABETES MELLITUS    Esophageal reflux    HYPERLIPIDEMIA, MILD    HYPERTENSION    Osteoarthritis     Past Surgical History:  Procedure Laterality Date   ABDOMINAL HYSTERECTOMY     partial   CHOLECYSTECTOMY     COLONOSCOPY WITH PROPOFOL N/A 08/15/2014   Procedure: COLONOSCOPY WITH PROPOFOL;  Surgeon: Garlan Fair, MD;  Location: [facility];  Service: Endoscopy;  Laterality: N/A;    SALPINGOOPHORECTOMY Right      Current Outpatient Medications  Medication Sig Dispense Refill   aspirin EC 81 MG tablet Take 81 mg by mouth daily.     furosemide (LASIX) 20 MG tablet Take 20 mg by mouth daily.     gabapentin (NEURONTIN) 300 MG capsule Take 300 mg by mouth every morning.      glimepiride (AMARYL) 4 MG tablet Take 4 mg by mouth daily with breakfast.     isosorbide mononitrate (IMDUR) 30 MG 24 hr tablet Take 1 tablet (30 mg total) by mouth daily. 90 tablet 3   metFORMIN (GLUCOPHAGE) 500 MG tablet TAKE ONE TABLET BY MOUTH THREE TIMES DAILY WITH MEALS     metoprolol succinate (TOPROL-XL) 25 MG 24 hr tablet      metoprolol tartrate (LOPRESSOR) 100 MG tablet Take one tablet by mouth 2 hours prior to CT Scan 1 tablet 0   nitroGLYCERIN (NITROSTAT) 0.4 MG SL tablet Place 1 tablet (0.4 mg total) under the tongue every 5 (five) minutes as needed for chest pain. 25 tablet 6   quinapril-hydrochlorothiazide (ACCURETIC) 20-12.5 MG tablet Take by mouth.     atorvastatin (LIPITOR) 20 MG tablet Take 20 mg by mouth.     naproxen (NAPROSYN) 500 MG tablet Take 500 mg  by mouth 2 (two) times daily as needed. (Patient not taking: Reported on 11/26/2021)     No current facility-administered medications for this visit.    Allergies:   Penicillins    Social History:  The patient  reports that she has never smoked. She has never used smokeless tobacco. She reports current alcohol use. She reports that she does not use drugs.   Family History:  The patient's family history includes Cancer in her paternal aunt; Heart failure in her father; Hypertension in her father and mother; Prostate cancer in her father.    ROS:  Please see the history of present illness.   Otherwise, review of systems are positive for stress at home, her husband and 6 year old son are unable to care for themselves.  Her son has cerebral palsy and she describes them as somewhat combative at times.  He resists when she tries to  help with certain activities like giving medications or feeding him.   All other systems are reviewed and negative.    PHYSICAL EXAM: VS:  BP 132/76   Pulse 78   Ht 4\' 11"  (1.499 m)   Wt 228 lb (103.4 kg)   SpO2 95%   BMI 46.05 kg/m  , BMI Body mass index is 46.05 kg/m. GEN: Well nourished, well developed, in no acute distress HEENT: normal Neck: no JVD, carotid bruits, or masses Cardiac: RRR; no murmurs, rubs, or gallops,no edema  Respiratory:  clear to auscultation bilaterally, normal work of breathing GI: soft, nontender, nondistended, + BS MS: no deformity or atrophy Skin: warm and dry, no rash Neuro:  Strength and sensation are intact Psych: euthymic mood, full affect   EKG:   The ekg ordered today demonstrates normal sinus rhythm, non specific ST segment changes   Recent Labs: 09/21/2021: ALT 25; Hemoglobin 11.7; Platelets 304.0; TSH 2.14 11/05/2021: BUN 13; Creatinine, Ser 0.84; Potassium 4.0; Sodium 138   Lipid Panel    Component Value Date/Time   CHOL 139 09/21/2021 1105   TRIG 128.0 09/21/2021 1105   HDL 49.40 09/21/2021 1105   CHOLHDL 3 09/21/2021 1105   VLDL 25.6 09/21/2021 1105   LDLCALC 64 09/21/2021 1105     Other studies Reviewed: Additional studies/ records that were reviewed today with results demonstrating: labs reviewed.  I personally reviewed her echo images and her LV function appears normal.  No obvious valvular abnormalities.   ASSESSMENT AND PLAN:  CAD: noted by CTA.  Imdur started.  Prior DOE could be an anginal equivalent.  Given the location of the stenosis, we discussed cardiac catheterization.  Her symptoms are somewhat atypical.  We will see how she feels.  Her most severe symptoms, when trying to help lift her 20 year old son.  We have tentatively plan for catheterization after Christmas on December 27.  If her symptoms completely resolve, could reconsider catheterization.  If she continues to have symptoms, I would proceed with  catheterization.  The procedure was explained to the patient.  Given her difficult social circumstances, she will need some time to get support at home to stay with her husband and son.  Ideally, we will do this as a same-day PCI and she will have an additional support person stay with all 3 of them.  Early morning calf is best for her.  I gave her the option of having one of my partners do the procedure before Christmas but she prefers to wait.  All questions about cardiac cath answered. HTN: The current medical regimen is effective;  continue present plan and medications. Type 2 DM: A1c 7.8.  Whole food, plant-based diet recommended. QFJ:[id number] using CPAP. Hyperlipidemia: LDL 64 in October 2022.   Current medicines are reviewed at length with the patient today.  The patient concerns regarding her medicines were addressed.  The following changes have been made:  No change  Labs/ tests ordered today include:   Orders Placed This Encounter  Procedures   Basic Metabolic Panel (BMET)   CBC   EKG 12-Lead    Recommend 150 minutes/week of aerobic exercise Low fat, low carb, high fiber diet recommended  Disposition:   FU post cath   Signed, Larae Grooms, MD  11/26/2021 5:15 PM    [facility] [place], [place], [place]  [address] Phone: [phone]; Fax: [phone]

## 2021-11-25 NOTE — H&P (View-Only) (Signed)
Cardiology Office Note   Date:  11/26/2021   ID:  [name] Washington, DOB [birth year], MRN [id number]  PCP:  Ailene Ards, NP    No chief complaint on file.  CAD  Wt Readings from Last 3 Encounters:  11/26/21 228 lb (103.4 kg)  11/05/21 227 lb (103 kg)  09/21/21 230 lb (104.3 kg)       History of Present Illness: [name] Washington is a 60 y.o. female   who is being seen today for the evaluation of chest pain at the request of Ailene Ards, NP.    Records show: "Main complaint today is chest pain and fatigue x 2 months. Triggers are heavy lifting, she describes pain as a "hurting" denies aching, stabbing, or soreness. The pain will usually occur a day after heavy lifting... has noticed more fatigue and bilateral lower extremity swelling."   She has had chest pain in the past that resolved with naprosyn.     She has decreased her activity a little and this has led to a decrease in sx.   Father had heart surgery.  No siblings with CAD.  Coronary CTA in 11/2021 showed severe mid LAD disease.  Imdur was prescribed but has not yet been started.  She was concerned about side effects.  She is willing to start the medicine now.  Denies : Dizziness. Leg edema. Nitroglycerin use. Orthopnea. Palpitations. Paroxysmal nocturnal dyspnea. Shortness of breath. Syncope.    Has some chest pain on occasion.  It is not daily.  Sometimes it is worse with exertion.     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    DIABETES MELLITUS    Esophageal reflux    HYPERLIPIDEMIA, MILD    HYPERTENSION    Osteoarthritis     Past Surgical History:  Procedure Laterality Date   ABDOMINAL HYSTERECTOMY     partial   CHOLECYSTECTOMY     COLONOSCOPY WITH PROPOFOL N/A 08/15/2014   Procedure: COLONOSCOPY WITH PROPOFOL;  Surgeon: Garlan Fair, MD;  Location: [facility];  Service: Endoscopy;  Laterality: N/A;    SALPINGOOPHORECTOMY Right      Current Outpatient Medications  Medication Sig Dispense Refill   aspirin EC 81 MG tablet Take 81 mg by mouth daily.     furosemide (LASIX) 20 MG tablet Take 20 mg by mouth daily.     gabapentin (NEURONTIN) 300 MG capsule Take 300 mg by mouth every morning.      glimepiride (AMARYL) 4 MG tablet Take 4 mg by mouth daily with breakfast.     isosorbide mononitrate (IMDUR) 30 MG 24 hr tablet Take 1 tablet (30 mg total) by mouth daily. 90 tablet 3   metFORMIN (GLUCOPHAGE) 500 MG tablet TAKE ONE TABLET BY MOUTH THREE TIMES DAILY WITH MEALS     metoprolol succinate (TOPROL-XL) 25 MG 24 hr tablet      metoprolol tartrate (LOPRESSOR) 100 MG tablet Take one tablet by mouth 2 hours prior to CT Scan 1 tablet 0   nitroGLYCERIN (NITROSTAT) 0.4 MG SL tablet Place 1 tablet (0.4 mg total) under the tongue every 5 (five) minutes as needed for chest pain. 25 tablet 6   quinapril-hydrochlorothiazide (ACCURETIC) 20-12.5 MG tablet Take by mouth.     atorvastatin (LIPITOR) 20 MG tablet Take 20 mg by mouth.     naproxen (NAPROSYN) 500 MG tablet Take 500 mg  by mouth 2 (two) times daily as needed. (Patient not taking: Reported on 11/26/2021)     No current facility-administered medications for this visit.    Allergies:   Penicillins    Social History:  The patient  reports that she has never smoked. She has never used smokeless tobacco. She reports current alcohol use. She reports that she does not use drugs.   Family History:  The patient's family history includes Cancer in her paternal aunt; Heart failure in her father; Hypertension in her father and mother; Prostate cancer in her father.    ROS:  Please see the history of present illness.   Otherwise, review of systems are positive for stress at home, her husband and 56 year old son are unable to care for themselves.  Her son has cerebral palsy and she describes them as somewhat combative at times.  He resists when she tries to  help with certain activities like giving medications or feeding him.   All other systems are reviewed and negative.    PHYSICAL EXAM: VS:  BP 132/76    Pulse 78    Ht 4\' 11"  (1.499 m)    Wt 228 lb (103.4 kg)    SpO2 95%    BMI 46.05 kg/m  , BMI Body mass index is 46.05 kg/m. GEN: Well nourished, well developed, in no acute distress HEENT: normal Neck: no JVD, carotid bruits, or masses Cardiac: RRR; no murmurs, rubs, or gallops,no edema  Respiratory:  clear to auscultation bilaterally, normal work of breathing GI: soft, nontender, nondistended, + BS MS: no deformity or atrophy Skin: warm and dry, no rash Neuro:  Strength and sensation are intact Psych: euthymic mood, full affect   EKG:   The ekg ordered today demonstrates normal sinus rhythm, non specific ST segment changes   Recent Labs: 09/21/2021: ALT 25; Hemoglobin 11.7; Platelets 304.0; TSH 2.14 11/05/2021: BUN 13; Creatinine, Ser 0.84; Potassium 4.0; Sodium 138   Lipid Panel    Component Value Date/Time   CHOL 139 09/21/2021 1105   TRIG 128.0 09/21/2021 1105   HDL 49.40 09/21/2021 1105   CHOLHDL 3 09/21/2021 1105   VLDL 25.6 09/21/2021 1105   LDLCALC 64 09/21/2021 1105     Other studies Reviewed: Additional studies/ records that were reviewed today with results demonstrating: labs reviewed.  I personally reviewed her echo images and her LV function appears normal.  No obvious valvular abnormalities.   ASSESSMENT AND PLAN:  CAD: noted by CTA.  Imdur started.  Prior DOE could be an anginal equivalent.  Given the location of the stenosis, we discussed cardiac catheterization.  Her symptoms are somewhat atypical.  We will see how she feels.  Her most severe symptoms, when trying to help lift her 46 year old son.  We have tentatively plan for catheterization after Christmas on December 27.  If her symptoms completely resolve, could reconsider catheterization.  If she continues to have symptoms, I would proceed with  catheterization.  The procedure was explained to the patient.  Given her difficult social circumstances, she will need some time to get support at home to stay with her husband and son.  Ideally, we will do this as a same-day PCI and she will have an additional support person stay with all 3 of them.  Early morning calf is best for her.  I gave her the option of having one of my partners do the procedure before Christmas but she prefers to wait.  All questions about cardiac cath answered. HTN: The  current medical regimen is effective;  continue present plan and medications. Type 2 DM: A1c 7.8.  Whole food, plant-based diet recommended. [id number] using CPAP. Hyperlipidemia: LDL 64 in October 2022.   Current medicines are reviewed at length with the patient today.  The patient concerns regarding her medicines were addressed.  The following changes have been made:  No change  Labs/ tests ordered today include:   Orders Placed This Encounter  Procedures   Basic Metabolic Panel (BMET)   CBC   EKG 12-Lead    Recommend 150 minutes/week of aerobic exercise Low fat, low carb, high fiber diet recommended  Disposition:   FU post cath   Signed, Larae Grooms, MD  11/26/2021 5:15 PM    [facility] [place], [place], [place]  [address] Phone: [phone]; Fax: [phone]

## 2021-11-26 ENCOUNTER — Ambulatory Visit (HOSPITAL_COMMUNITY): Payer: 59 | Attending: Cardiology

## 2021-11-26 ENCOUNTER — Ambulatory Visit (INDEPENDENT_AMBULATORY_CARE_PROVIDER_SITE_OTHER): Payer: 59 | Admitting: Interventional Cardiology

## 2021-11-26 ENCOUNTER — Other Ambulatory Visit: Payer: Self-pay

## 2021-11-26 ENCOUNTER — Encounter: Payer: Self-pay | Admitting: Interventional Cardiology

## 2021-11-26 VITALS — BP 132/76 | HR 78 | Ht 59.0 in | Wt 228.0 lb

## 2021-11-26 DIAGNOSIS — E119 Type 2 diabetes mellitus without complications: Secondary | ICD-10-CM | POA: Diagnosis not present

## 2021-11-26 DIAGNOSIS — E782 Mixed hyperlipidemia: Secondary | ICD-10-CM

## 2021-11-26 DIAGNOSIS — I10 Essential (primary) hypertension: Secondary | ICD-10-CM | POA: Diagnosis not present

## 2021-11-26 DIAGNOSIS — R079 Chest pain, unspecified: Secondary | ICD-10-CM | POA: Diagnosis not present

## 2021-11-26 DIAGNOSIS — R072 Precordial pain: Secondary | ICD-10-CM | POA: Insufficient documentation

## 2021-11-26 DIAGNOSIS — I2510 Atherosclerotic heart disease of native coronary artery without angina pectoris: Secondary | ICD-10-CM

## 2021-11-26 LAB — ECHOCARDIOGRAM COMPLETE
Area-P 1/2: 4.15 cm2
S' Lateral: 3.1 cm

## 2021-11-26 NOTE — Patient Instructions (Addendum)
Medication Instructions:  Your physician recommends that you continue on your current medications as directed. Please refer to the Current Medication list given to you today.  *If you need a refill on your cardiac medications before your next appointment, please call your pharmacy*   Lab Work: Lab work to be done today--BMP and CBC If you have labs (blood work) drawn today and your tests are completely normal, you will receive your results only by: [place] (if you have MyChart) OR A paper copy in the mail If you have any lab test that is abnormal or we need to change your treatment, we will call you to review the results.   Testing/Procedures: Your physician has requested that you have a cardiac catheterization. Cardiac catheterization is used to diagnose and/or treat various heart conditions. Doctors may recommend this procedure for a number of different reasons. The most common reason is to evaluate chest pain. Chest pain can be a symptom of coronary artery disease (CAD), and cardiac catheterization can show whether plaque is narrowing or blocking your heart's arteries. This procedure is also used to evaluate the valves, as well as measure the blood flow and oxygen levels in different parts of your heart. For further information please visit [email or web address]. Please follow instruction sheet, as given. Scheduled for 12/11/21   Follow-Up: At [facility], you and your health needs are our priority.  As part of our continuing mission to provide you with exceptional heart care, we have created designated Provider Care Teams.  These Care Teams include your primary Cardiologist (physician) and Advanced Practice Providers (APPs -  Physician Assistants and Nurse Practitioners) who all work together to provide you with the care you need, when you need it.  We recommend signing up for the patient portal called "MyChart".  Sign up information is provided on this After Visit Summary.   MyChart is used to connect with patients for Virtual Visits (Telemedicine).  Patients are able to view lab/test results, encounter notes, upcoming appointments, etc.  Non-urgent messages can be sent to your provider as well.   To learn more about what you can do with MyChart, go to [email or web address].    Your next appointment:   To be arranged after procedure  The format for your next appointment:   In Person  Provider:   Larae Grooms, MD     Other [place] OFFICE [place], [place] [address] Dept: [phone] Loc: [place]  11/26/2021  You are scheduled for a Cardiac Catheterization on Tuesday, December 27 with Dr. Larae Grooms.  1. Please arrive at the [facility] (Main Entrance A) at [facility]: [address] [place], [place] [address] at 7:00 AM(This time is two hours before your procedure to ensure your preparation). Free valet parking service is available.   Special note: Every effort is made to have your procedure done on time. Please understand that emergencies sometimes delay scheduled procedures.  2. Diet: Do not eat solid foods after midnight.  The patient may have clear liquids until 5am upon the day of the procedure.  3. Labs: done in office today.  4. Medication instructions in preparation for your procedure:  Do not take any diabetes medication the morning of the procedure. Do not take furosemide and quinapril/hydrochlorothiazide the morning of the procedure.    Contrast Allergy: No    On the morning of your procedure, take your  Aspirin and any morning medicines NOT listed above.  You may use sips of water.  5. Plan for one night stay--bring personal belongings. 6. Bring a current list of your medications and current insurance cards. 7. You MUST have a responsible person to drive you home. 8.  Someone MUST be with you the first 24 hours after you arrive home or your discharge will be delayed. 9. Please wear clothes that are easy to get on and off and wear slip-on shoes.  Thank you for allowing [place] to care for you!   -- [facility]

## 2021-11-27 LAB — CBC
Hematocrit: 35.4 % (ref 34.0–46.6)
Hemoglobin: 11.5 g/dL (ref 11.1–15.9)
MCH: 26.8 pg (ref 26.6–33.0)
MCHC: 32.5 g/dL (ref 31.5–35.7)
MCV: 83 fL (ref 79–97)
Platelets: 339 x10E3/uL (ref 150–450)
RBC: 4.29 x10E6/uL (ref 3.77–5.28)
RDW: 14.3 % (ref 11.7–15.4)
WBC: 7.5 x10E3/uL (ref 3.4–10.8)

## 2021-11-27 LAB — BASIC METABOLIC PANEL WITH GFR
BUN/Creatinine Ratio: 12 (ref 12–28)
BUN: 10 mg/dL (ref 8–27)
CO2: 22 mmol/L (ref 20–29)
Calcium: 9.6 mg/dL (ref 8.7–10.3)
Chloride: 106 mmol/L (ref 96–106)
Creatinine, Ser: 0.84 mg/dL (ref 0.57–1.00)
Glucose: 87 mg/dL (ref 70–99)
Potassium: 4.4 mmol/L (ref 3.5–5.2)
Sodium: 143 mmol/L (ref 134–144)
eGFR: 80 mL/min/1.73

## 2021-12-06 ENCOUNTER — Telehealth: Payer: Self-pay | Admitting: *Deleted

## 2021-12-06 NOTE — Telephone Encounter (Signed)
Cardiac catheterization scheduled at [facility] for: Tuesday December 11, 2021 [facility] Main Entrance A [facility]) at: 7 AM   Diet-no solid food after midnight prior to cath, clear liquids until 5 AM day of procedure.  Medication instructions for procedure: -Hold:  Metformin-day of procedure and 48 hours after  Glimepiride-AM of procedure  Furosemide-AM of procedure  Quinapril/HCT-AM of procedure -Except hold medications usual morning medications can be taken pre-cath with sips of water including aspirin 81 mg.    Confirmed patient has responsible adult to drive home post procedure and be with patient first 24 hours after arriving home.  [facility] does allow one visitor to accompany you and wait in the hospital waiting room while you are there for your procedure. You and your visitor will be asked to wear a mask once you enter the hospital.   Patient reports does not currently have any new symptoms concerning for COVID-19 and no household members with COVID-19 like illness.    Reviewed procedure/mask/visitor instructions with patient.

## 2021-12-11 ENCOUNTER — Encounter (HOSPITAL_COMMUNITY)
Admission: RE | Disposition: A | Discharge status: Home / Self Care | Payer: Self-pay | Source: Home / Self Care | Attending: Interventional Cardiology

## 2021-12-11 ENCOUNTER — Other Ambulatory Visit: Payer: Self-pay

## 2021-12-11 ENCOUNTER — Ambulatory Visit (HOSPITAL_COMMUNITY)
Admission: RE | Admit: 2021-12-11 | Discharge: 2021-12-11 | Disposition: A | Discharge status: Home / Self Care | Payer: 59 | Attending: Interventional Cardiology | Admitting: Interventional Cardiology

## 2021-12-11 ENCOUNTER — Other Ambulatory Visit (HOSPITAL_COMMUNITY): Payer: Self-pay

## 2021-12-11 DIAGNOSIS — E785 Hyperlipidemia, unspecified: Secondary | ICD-10-CM | POA: Insufficient documentation

## 2021-12-11 DIAGNOSIS — Z79899 Other long term (current) drug therapy: Secondary | ICD-10-CM | POA: Diagnosis not present

## 2021-12-11 DIAGNOSIS — I25118 Atherosclerotic heart disease of native coronary artery with other forms of angina pectoris: Secondary | ICD-10-CM

## 2021-12-11 DIAGNOSIS — E119 Type 2 diabetes mellitus without complications: Secondary | ICD-10-CM | POA: Diagnosis not present

## 2021-12-11 DIAGNOSIS — I2510 Atherosclerotic heart disease of native coronary artery without angina pectoris: Secondary | ICD-10-CM

## 2021-12-11 DIAGNOSIS — G4733 Obstructive sleep apnea (adult) (pediatric): Secondary | ICD-10-CM | POA: Diagnosis not present

## 2021-12-11 DIAGNOSIS — I10 Essential (primary) hypertension: Secondary | ICD-10-CM | POA: Diagnosis not present

## 2021-12-11 DIAGNOSIS — Z955 Presence of coronary angioplasty implant and graft: Secondary | ICD-10-CM

## 2021-12-11 HISTORY — PX: LEFT HEART CATH AND CORONARY ANGIOGRAPHY: CATH118249

## 2021-12-11 HISTORY — PX: INTRAVASCULAR ULTRASOUND/IVUS: CATH118244

## 2021-12-11 HISTORY — PX: CORONARY STENT INTERVENTION: CATH118234

## 2021-12-11 LAB — GLUCOSE, CAPILLARY: Glucose-Capillary: 120 mg/dL — ABNORMAL HIGH (ref 70–99)

## 2021-12-11 LAB — POCT ACTIVATED CLOTTING TIME
Activated Clotting Time: 287 seconds
Activated Clotting Time: 299 seconds

## 2021-12-11 SURGERY — LEFT HEART CATH AND CORONARY ANGIOGRAPHY
Anesthesia: LOCAL

## 2021-12-11 MED ORDER — LIDOCAINE HCL (PF) 1 % IJ SOLN
INTRAMUSCULAR | Status: DC | PRN
  Administered 2021-12-11: 2 mL

## 2021-12-11 MED ORDER — FUROSEMIDE 20 MG PO TABS: 20.0000 mg | ORAL_TABLET | Freq: Every day | ORAL | Status: DC

## 2021-12-11 MED ORDER — FENTANYL CITRATE (PF) 100 MCG/2ML IJ SOLN
INTRAMUSCULAR | Status: AC
  Filled 2021-12-11: qty 2

## 2021-12-11 MED ORDER — LABETALOL HCL 5 MG/ML IV SOLN: 10.0000 mg | INTRAVENOUS | Status: AC | PRN

## 2021-12-11 MED ORDER — NITROGLYCERIN 0.4 MG SL SUBL: 0.4000 mg | SUBLINGUAL_TABLET | SUBLINGUAL | Status: DC | PRN

## 2021-12-11 MED ORDER — ACETAMINOPHEN 325 MG PO TABS: 650.0000 mg | ORAL_TABLET | ORAL | Status: DC | PRN

## 2021-12-11 MED ORDER — SODIUM CHLORIDE 0.9 % IV SOLN: 250.0000 mL | INTRAVENOUS | Status: DC | PRN

## 2021-12-11 MED ORDER — METOPROLOL SUCCINATE ER 25 MG PO TB24
25.0000 mg | ORAL_TABLET | Freq: Every day | ORAL | Status: DC
Start: 2021-12-11 — End: 2021-12-11

## 2021-12-11 MED ORDER — MULTI-VITAMIN/MINERALS PO TABS: 1.0000 | ORAL_TABLET | Freq: Every day | ORAL | Status: DC

## 2021-12-11 MED ORDER — LORATADINE 10 MG PO TABS: 10.0000 mg | ORAL_TABLET | Freq: Every day | ORAL | Status: DC

## 2021-12-11 MED ORDER — FAMOTIDINE IN NACL 20-0.9 MG/50ML-% IV SOLN
INTRAVENOUS | Status: AC
  Filled 2021-12-11: qty 50

## 2021-12-11 MED ORDER — METFORMIN HCL 500 MG PO TABS: 1000.0000 mg | ORAL_TABLET | Freq: Two times a day (BID) | ORAL | Status: DC

## 2021-12-11 MED ORDER — ASPIRIN 81 MG PO CHEW: 81.0000 mg | CHEWABLE_TABLET | Freq: Every day | ORAL | Status: DC

## 2021-12-11 MED ORDER — ASPIRIN 81 MG PO CHEW: 81.0000 mg | CHEWABLE_TABLET | ORAL | Status: DC

## 2021-12-11 MED ORDER — ISOSORBIDE MONONITRATE ER 30 MG PO TB24: 30.0000 mg | ORAL_TABLET | Freq: Every day | ORAL | Status: DC

## 2021-12-11 MED ORDER — MIDAZOLAM HCL 2 MG/2ML IJ SOLN
INTRAMUSCULAR | Status: AC
  Filled 2021-12-11: qty 2

## 2021-12-11 MED ORDER — MIDAZOLAM HCL 2 MG/2ML IJ SOLN
INTRAMUSCULAR | Status: DC | PRN
  Administered 2021-12-11: 2 mg via INTRAVENOUS
  Administered 2021-12-11: 1 mg via INTRAVENOUS

## 2021-12-11 MED ORDER — HEPARIN SODIUM (PORCINE) 1000 UNIT/ML IJ SOLN
INTRAMUSCULAR | Status: DC | PRN
  Administered 2021-12-11: 7000 [IU] via INTRAVENOUS
  Administered 2021-12-11: 2000 [IU] via INTRAVENOUS
  Administered 2021-12-11: 5000 [IU] via INTRAVENOUS

## 2021-12-11 MED ORDER — VERAPAMIL HCL 2.5 MG/ML IV SOLN
INTRAVENOUS | Status: AC
  Filled 2021-12-11: qty 2

## 2021-12-11 MED ORDER — ATORVASTATIN CALCIUM 10 MG PO TABS: 20.0000 mg | ORAL_TABLET | Freq: Every day | ORAL | Status: DC

## 2021-12-11 MED ORDER — SODIUM CHLORIDE 0.9 % WEIGHT BASED INFUSION
3.0000 mL/kg/h | INTRAVENOUS | Status: DC
  Administered 2021-12-11: 08:00:00 3 mL/kg/h via INTRAVENOUS

## 2021-12-11 MED ORDER — ONDANSETRON HCL 4 MG/2ML IJ SOLN
INTRAMUSCULAR | Status: AC
  Filled 2021-12-11: qty 2

## 2021-12-11 MED ORDER — NITROGLYCERIN 1 MG/10 ML FOR IR/CATH LAB
INTRA_ARTERIAL | Status: AC
  Filled 2021-12-11: qty 10

## 2021-12-11 MED ORDER — SODIUM CHLORIDE 0.9% FLUSH: 3.0000 mL | INTRAVENOUS | Status: DC | PRN

## 2021-12-11 MED ORDER — LIDOCAINE HCL (PF) 1 % IJ SOLN
INTRAMUSCULAR | Status: AC
  Filled 2021-12-11: qty 30

## 2021-12-11 MED ORDER — GLIMEPIRIDE 4 MG PO TABS: 4.0000 mg | ORAL_TABLET | Freq: Every day | ORAL | Status: DC

## 2021-12-11 MED ORDER — FENTANYL CITRATE (PF) 100 MCG/2ML IJ SOLN
INTRAMUSCULAR | Status: DC | PRN
  Administered 2021-12-11 (×2): 25 ug via INTRAVENOUS
  Administered 2021-12-11: 50 ug via INTRAVENOUS

## 2021-12-11 MED ORDER — HEPARIN (PORCINE) IN NACL 1000-0.9 UT/500ML-% IV SOLN
INTRAVENOUS | Status: AC
  Filled 2021-12-11: qty 1000

## 2021-12-11 MED ORDER — GABAPENTIN 300 MG PO CAPS: 300.0000 mg | ORAL_CAPSULE | Freq: Every morning | ORAL | Status: DC

## 2021-12-11 MED ORDER — HYDRALAZINE HCL 20 MG/ML IJ SOLN: 10.0000 mg | INTRAMUSCULAR | Status: AC | PRN

## 2021-12-11 MED ORDER — HEPARIN SODIUM (PORCINE) 1000 UNIT/ML IJ SOLN
INTRAMUSCULAR | Status: AC
  Filled 2021-12-11: qty 10

## 2021-12-11 MED ORDER — SODIUM CHLORIDE 0.9% FLUSH: 3.0000 mL | Freq: Two times a day (BID) | INTRAVENOUS | Status: DC

## 2021-12-11 MED ORDER — IOHEXOL 350 MG/ML SOLN
INTRAVENOUS | Status: DC | PRN
  Administered 2021-12-11: 10:00:00 110 mL

## 2021-12-11 MED ORDER — ASPIRIN EC 81 MG PO TBEC: 81.0000 mg | DELAYED_RELEASE_TABLET | Freq: Every day | ORAL | Status: DC

## 2021-12-11 MED ORDER — HEPARIN (PORCINE) IN NACL 1000-0.9 UT/500ML-% IV SOLN
INTRAVENOUS | Status: DC | PRN
  Administered 2021-12-11 (×2): 500 mL

## 2021-12-11 MED ORDER — CLOPIDOGREL BISULFATE 300 MG PO TABS
ORAL_TABLET | ORAL | Status: AC
  Filled 2021-12-11: qty 2

## 2021-12-11 MED ORDER — ATORVASTATIN CALCIUM 80 MG PO TABS
80.0000 mg | ORAL_TABLET | Freq: Every day | ORAL | 3 refills | Status: DC
  Filled 2021-12-11: qty 90, 90d supply, fill #0

## 2021-12-11 MED ORDER — NITROGLYCERIN 1 MG/10 ML FOR IR/CATH LAB
INTRA_ARTERIAL | Status: DC | PRN
  Administered 2021-12-11: 400 ug via INTRA_ARTERIAL
  Administered 2021-12-11 (×2): 200 ug via INTRACORONARY

## 2021-12-11 MED ORDER — CLOPIDOGREL BISULFATE 75 MG PO TABS
75.0000 mg | ORAL_TABLET | Freq: Every day | ORAL | 3 refills | Status: DC
  Filled 2021-12-11: qty 90, 90d supply, fill #0

## 2021-12-11 MED ORDER — CLOPIDOGREL BISULFATE 300 MG PO TABS
ORAL_TABLET | ORAL | Status: DC | PRN
  Administered 2021-12-11: 600 mg via ORAL

## 2021-12-11 MED ORDER — CLOPIDOGREL BISULFATE 75 MG PO TABS: 75.0000 mg | ORAL_TABLET | Freq: Every day | ORAL | Status: DC

## 2021-12-11 MED ORDER — VERAPAMIL HCL 2.5 MG/ML IV SOLN
INTRAVENOUS | Status: DC | PRN
  Administered 2021-12-11: 09:00:00 10 mL via INTRA_ARTERIAL

## 2021-12-11 MED ORDER — ONDANSETRON HCL 4 MG/2ML IJ SOLN
INTRAMUSCULAR | Status: DC | PRN
  Administered 2021-12-11: 4 mg via INTRAVENOUS

## 2021-12-11 MED ORDER — ONDANSETRON HCL 4 MG/2ML IJ SOLN: 4.0000 mg | Freq: Four times a day (QID) | INTRAMUSCULAR | Status: DC | PRN

## 2021-12-11 MED ORDER — FAMOTIDINE IN NACL 20-0.9 MG/50ML-% IV SOLN
INTRAVENOUS | Status: DC | PRN
  Administered 2021-12-11: 20 mg via INTRAVENOUS

## 2021-12-11 MED ORDER — SODIUM CHLORIDE 0.9 % IV SOLN: INTRAVENOUS | Status: AC

## 2021-12-11 MED ORDER — QUINAPRIL-HYDROCHLOROTHIAZIDE 20-12.5 MG PO TABS: 1.0000 | ORAL_TABLET | Freq: Every day | ORAL | Status: DC

## 2021-12-11 MED ORDER — SODIUM CHLORIDE 0.9 % WEIGHT BASED INFUSION: 1.0000 mL/kg/h | INTRAVENOUS | Status: DC

## 2021-12-11 SURGICAL SUPPLY — 19 items
BALLN SAPPHIRE ~~LOC~~ 4.0X15 (BALLOONS) ×2 IMPLANT
BALLN SCOREFLEX 2.50X10 (BALLOONS) ×3
BALLOON SCOREFLEX 2.50X10 (BALLOONS) IMPLANT
CATH INFINITI 5 FR JL3.5 (CATHETERS) ×2 IMPLANT
CATH INFINITI JR4 5F (CATHETERS) ×2 IMPLANT
CATH LAUNCHER 6FR JR4 (CATHETERS) ×2 IMPLANT
CATH OPTICROSS HD (CATHETERS) ×2 IMPLANT
DEVICE RAD COMP TR BAND LRG (VASCULAR PRODUCTS) ×2 IMPLANT
GLIDESHEATH SLEND SS 6F .021 (SHEATH) ×2 IMPLANT
GUIDEWIRE INQWIRE 1.5J.035X260 (WIRE) IMPLANT
INQWIRE 1.5J .035X260CM (WIRE) ×3
KIT ENCORE 26 ADVANTAGE (KITS) ×2 IMPLANT
KIT HEART LEFT (KITS) ×3 IMPLANT
PACK CARDIAC CATHETERIZATION (CUSTOM PROCEDURE TRAY) ×3 IMPLANT
SLED PULL BACK IVUS (MISCELLANEOUS) ×2 IMPLANT
STENT ONYX FRONTIER 4.0X18 (Permanent Stent) ×2 IMPLANT
TRANSDUCER W/STOPCOCK (MISCELLANEOUS) ×3 IMPLANT
TUBING CIL FLEX 10 FLL-RA (TUBING) ×3 IMPLANT
WIRE ASAHI PROWATER 180CM (WIRE) ×2 IMPLANT

## 2021-12-11 NOTE — Interval H&P Note (Signed)
Cath Lab Visit (complete for each Cath Lab visit)  Clinical Evaluation Leading to the Procedure:   ACS: No.  Non-ACS:    Anginal Classification: CCS III  Anti-ischemic medical therapy: Minimal Therapy (1 class of medications)  Non-Invasive Test Results: High-risk stress test findings: cardiac mortality >3%/year  Prior CABG: No previous CABG  Abnormal CTA.     History and Physical Interval Note:  12/11/2021 9:10 AM  [name] Washington  has presented today for surgery, with the diagnosis of chest pain - abnormal ct.  The various methods of treatment have been discussed with the patient and family. After consideration of risks, benefits and other options for treatment, the patient has consented to  Procedure(s): LEFT HEART CATH AND CORONARY ANGIOGRAPHY (N/A) as a surgical intervention.  The patient's history has been reviewed, patient examined, no change in status, stable for surgery.  I have reviewed the patient's chart and labs.  Questions were answered to the patient's satisfaction.     Larae Grooms

## 2021-12-11 NOTE — Discharge Summary (Addendum)
Discharge Summary for Same Day PCI   Patient ID: [name] Washington MRN: [id number]; DOB: January 02, 1961  Admit date: 12/11/2021 Discharge date: 12/11/2021  Primary Care Provider: Ailene Ards, NP  Primary Cardiologist: Larae Grooms, MD  Primary Electrophysiologist:  None   Discharge Diagnoses    Active Problems:   Coronary artery disease   HTN   HLD  DM   Diagnostic Studies/Procedures    Cardiac Catheterization 12/11/2021:  CORONARY STENT INTERVENTION  Intravascular Ultrasound/IVUS  LEFT HEART CATH AND CORONARY ANGIOGRAPHY   Conclusion      Mid Cx lesion is 25% stenosed.   Mid LAD lesion is 50% stenosed.   Mid RCA lesion is 40% stenosed.   2nd Diag lesion is 50% stenosed.   Prox RCA lesion is 80% stenosed.   A drug-eluting stent was successfully placed using a STENT ONYX FRONTIER 4.0X18, and optimized with IVUS.   Post intervention, there is a 0% residual stenosis.   RPAV lesion is 70% stenosed.   RPDA lesion is 50% stenosed.   The left ventricular systolic function is normal.   LV end diastolic pressure is normal.   The left ventricular ejection fraction is 55-65% by visual estimate.   There is no aortic valve stenosis.   Diffuse, calcific atherosclerotic disease.  This is most notable throughout the LAD.  In the midportion, there is a focal, eccentric 50% area of stenosis after a long, diffuse area of calcification.     The distal RCA branches also have a moderate amount of plaque diffusely, consistent with her diabetes.  The circumflex is her largest vessel and is widely patent.   Continue dual antiplatelet therapy.  Plan for clopidogrel monotherapy after her 6 to 12 months of dual antiplatelet therapy.  She will need aggressive medical therapy including high intensity statin and potentially other lipid-lowering options depending on how she responds.  She will need healthy diet, regular exercise and aggressive diabetes control.  Diagnostic Dominance:  Right Intervention     History of Present Illness     [name] Washington is a 60 y.o. female with hx of HTN, HLD, OSA on CPAP and DM who recently established care with Dr. Irish Washington for chest pain. Also has DOE with fatigue. Symptoms felt somewhat atypical. Her most severe symptoms, when trying to help lift her 75 year old son. Coronary CT showed severe LAD and distal Lcx stenosis. Echo showed normal LVEF. Cardiac catheterization was arranged for further evaluation.  Hospital Course     The patient underwent cardiac cath as noted above with 80% pRCA stenosis s/p PTCA & DES. Medical therapy for other non obstructive CAD. Plan for DAPT with ASA/Plavix.  Plan for clopidogrel monotherapy after her 6 to 12 months of dual antiplatelet therapy.  The patient was seen by cardiac rehab while in short stay. There were no observed complications post cath. Radial cath site was re-evaluated prior to discharge and found to be stable without any complications. Instructions/precautions regarding cath site care were given prior to discharge.  [name] Washington was seen by Dr. Irish Washington and determined stable for discharge home. Follow up with our office has been arranged. Medications are listed below. Pertinent changes include addition of Plavix and increase Lipitor to 80mg  qd from 20mg  qd. Lipid panel and LFTS in 8 weeks. Advised to hold Metformin for 2 days.   _____________  Cath/PCI Registry Performance & Quality Measures: Aspirin prescribed? - Yes ADP Receptor Inhibitor (Plavix/Clopidogrel, Brilinta/Ticagrelor or Effient/Prasugrel) prescribed (includes medically managed patients)? - Yes High  Intensity Statin (Lipitor 40-80mg  or Crestor 20-40mg ) prescribed? - Yes For EF <40%, was ACEI/ARB prescribed? - Yes For EF <40%, Aldosterone Antagonist (Spironolactone or Eplerenone) prescribed? - Not Applicable (EF >/= 65%) Cardiac Rehab Phase II ordered (Included Medically managed Patients)? - Yes  Discharge Vitals Blood  pressure 128/71, pulse 66, temperature 98 F (36.7 C), temperature source Oral, resp. rate 16, height 4\' 11"  (1.499 m), weight 103.4 kg, SpO2 100 %.  Filed Weights   12/11/21 0726  Weight: 103.4 kg    Last Labs & Radiologic Studies   _____________  CARDIAC CATHETERIZATION  Result Date: 12/11/2021   Mid Cx lesion is 25% stenosed.   Mid LAD lesion is 50% stenosed.   Mid RCA lesion is 40% stenosed.   2nd Diag lesion is 50% stenosed.   Prox RCA lesion is 80% stenosed.   A drug-eluting stent was successfully placed using a STENT ONYX FRONTIER 4.0X18, and optimized with IVUS.   Post intervention, there is a 0% residual stenosis.   RPAV lesion is 70% stenosed.   RPDA lesion is 50% stenosed.   The left ventricular systolic function is normal.   LV end diastolic pressure is normal.   The left ventricular ejection fraction is 55-65% by visual estimate.   There is no aortic valve stenosis. Diffuse, calcific atherosclerotic disease.  This is most notable throughout the LAD.  In the midportion, there is a focal, eccentric 50% area of stenosis after a long, diffuse area of calcification.  The distal RCA branches also have a moderate amount of plaque diffusely, consistent with her diabetes.  The circumflex is her largest vessel and is widely patent. Continue dual antiplatelet therapy.  Plan for clopidogrel monotherapy after her 6 to 12 months of dual antiplatelet therapy.  She will need aggressive medical therapy including high intensity statin and potentially other lipid-lowering options depending on how she responds.  She will need healthy diet, regular exercise and aggressive diabetes control. She has a difficult home situation due to caring for both a son with special needs and her husband who has health issues.  We will try to see if there are options for assistance for her.   CT CORONARY MORPH W/CTA COR W/SCORE W/CA W/CM &/OR WO/CM  Addendum Date: 11/20/2021   ADDENDUM REPORT: 11/20/2021 13:18 CLINICAL DATA:   6F with diabetes, hypertension, hyperlipidemia and chest pain. EXAM: Cardiac/Coronary  CT TECHNIQUE: The patient was scanned on a [employer]. FINDINGS: A 120 kV prospective scan was triggered in the descending thoracic aorta at 111 HU's. Axial non-contrast 3 mm slices were carried out through the heart. The data set was analyzed on a dedicated work station and scored using the [place]. Gantry rotation speed was 250 msecs and collimation was .6 mm. No beta blockade and 0.8 mg of sl NTG was given. The 3D data set was reconstructed in 5% intervals of the 67-82 % of the R-R cycle. Diastolic phases were analyzed on a dedicated work station using MPR, MIP and VRT modes. The patient received 80 cc of contrast. Aorta: Normal size.  Aortic atherosclerosis.  No dissection. Aortic Valve:  Trileaflet.  No calcifications. Coronary Arteries:  Normal coronary origin.  Right dominance. RCA is a large dominant artery that gives rise to PDA and PLVB. There is diffuse mild-moderate calcified plaque. Left main is a large artery that gives rise to LAD and LCX arteries. LAD is a large vessel that has mild (25-49%) calcified plaque proximally. The LAD is diffusely calcified. There  are two regions of severe (>70%) calcified plaque in the mid LAD. There is a tiny D1 and D2 has mild (25-49%) calcified plaque. LCX is a non-dominant artery that gives rise to one large OM1 branch. There is minimal (<25%) calcified plaque int he proximal LCX. OM1 has mild (25-49%) calcified plaque proximally and moderate (50-69%) plaque in the mid vessel. The LCX is tiny distal to OM1. Coronary Calcium Score: Left main: 0 Left anterior descending artery: 1045 Left circumflex artery: 563 Right coronary artery: 1304 Total: 2912 Percentile: 99th Other findings: Normal pulmonary vein drainage into the left atrium. Normal let atrial appendage without a thrombus. Normal size of the pulmonary artery. IMPRESSION: 1. Coronary calcium score of 2912.  This was 99th percentile for age-, race-, and sex-matched controls. 2. Normal coronary origin with right dominance. 3. There is severe (>70%) plaque in the mid LAD. There is mild (25-49%) plaque in D2 and moderate (50-69%) plaque in OM1. CAD-RADS 4. 4.  Will send study for FFRct. 5.  Aortic atherosclerosis. Skeet Latch, MD Electronically Signed   By: Skeet Latch M.D.   On: 11/20/2021 13:18   Result Date: 11/20/2021 EXAM: OVER-READ INTERPRETATION  CT CHEST The following report is an over-read performed by radiologist Dr. Vinnie Langton of [facility], [place] on 11/20/2021. This over-read does not include interpretation of cardiac or coronary anatomy or pathology. The coronary calcium score/coronary CTA interpretation by the cardiologist is attached. COMPARISON:  None. FINDINGS: Atherosclerotic calcifications in the thoracic aorta. Within the visualized portions of the thorax there are no suspicious appearing pulmonary nodules or masses, there is no acute consolidative airspace disease, no pleural effusions, no pneumothorax and no lymphadenopathy. Visualized portions of the upper abdomen demonstrates diffuse low attenuation throughout the visualized hepatic parenchyma, indicative of hepatic steatosis. There are no aggressive appearing lytic or blastic lesions noted in the visualized portions of the skeleton. IMPRESSION: 1.  Aortic Atherosclerosis (ICD10-I70.0). 2. Hepatic steatosis. Electronically Signed: By: Vinnie Langton M.D. On: 11/20/2021 09:09   CT CORONARY FRACTIONAL FLOW RESERVE DATA PREP  Result Date: 11/20/2021 EXAM: FFRCT ANALYSIS with abnormal coronary CTA. FINDINGS: FFRct analysis was performed on the original cardiac CT angiogram dataset. Diagrammatic representation of the FFRct analysis is provided in a separate PDF document in PACS. This dictation was created using the PDF document and an interactive 3D model of the results. 3D model is not available in the EMR/PACS. Normal FFR  range is >0.80. 1. Left Main: FFRct 1.0 proximal (insignificant) 2. LAD: FFRct 0.98 proximal, 0.63 mid, <0.5 distal (significant) 3. LCX: FFRct 0.99 proximal, 0.92 mid, 0.57 distal (significant) 4. RCA: FFRct 0.96 proximal, 0.88 mid, 0.81 distal (insignificant) IMPRESSION: 1.  FFRct findings are consistent with severe LAD stenosis. 2. FFRct findings are consistent with severe distal LCX stenosis. However, this is a small vessel, so findings may not be clinically significant in this region. 3.  Consider cardiac catheterization if clinically indicated. 4. Recommend aggressive risk factor modification including LDL goal <70. Tiffany C. Oval Linsey, MD Electronically Signed   By: Skeet Latch M.D.   On: 11/20/2021 14:36   ECHOCARDIOGRAM COMPLETE  Result Date: 11/26/2021    ECHOCARDIOGRAM REPORT   Patient Name:   SAREENA ODEH Date of Exam: 11/26/2021 Medical Rec #:  [id number]      Height:       59.0 in Accession #:    [id number]     Weight:       227.0 lb Date of Birth:  12-Nov-1961  BSA:          1.946 m Patient Age:    60 years       BP:           128/82 mmHg Patient Gender: F              HR:           75 bpm. Exam Location:  [address] Procedure: 2D Echo, Cardiac Doppler and Color Doppler Indications:    R07.9* Chest pain, unspecified  History:        Patient has no prior history of Echocardiogram examinations.                 Risk Factors:Hypertension, Diabetes and Dyslipidemia.  Sonographer:    Diamond Nickel RCS Referring Phys: Jettie Booze IMPRESSIONS  1. Left ventricular ejection fraction, by estimation, is 60 to 65%. The left ventricle has normal function. The left ventricle has no regional wall motion abnormalities. There is mild concentric left ventricular hypertrophy. Left ventricular diastolic parameters are consistent with Grade I diastolic dysfunction (impaired relaxation).  2. Right ventricular systolic function is normal. The right ventricular size is normal.  3. The mitral valve  is normal in structure. Trivial mitral valve regurgitation.  4. The aortic valve is tricuspid. There is mild calcification of the aortic valve. There is mild thickening of the aortic valve. Aortic valve regurgitation is trivial. Aortic valve sclerosis/calcification is present, without any evidence of aortic stenosis. Comparison(s): No prior Echocardiogram. FINDINGS  Left Ventricle: Left ventricular ejection fraction, by estimation, is 60 to 65%. The left ventricle has normal function. The left ventricle has no regional wall motion abnormalities. The left ventricular internal cavity size was normal in size. There is  mild concentric left ventricular hypertrophy. Left ventricular diastolic parameters are consistent with Grade I diastolic dysfunction (impaired relaxation). Normal left ventricular filling pressure. Right Ventricle: The right ventricular size is normal. No increase in right ventricular wall thickness. Right ventricular systolic function is normal. Left Atrium: Left atrial size was normal in size. Right Atrium: Right atrial size was normal in size. Pericardium: There is no evidence of pericardial effusion. Mitral Valve: The mitral valve is normal in structure. There is mild thickening of the mitral valve leaflet(s). There is mild calcification of the mitral valve leaflet(s). Mild mitral annular calcification. Trivial mitral valve regurgitation. Tricuspid Valve: The tricuspid valve is normal in structure. Tricuspid valve regurgitation is trivial. Aortic Valve: The aortic valve is tricuspid. There is mild calcification of the aortic valve. There is mild thickening of the aortic valve. Aortic valve regurgitation is trivial. Aortic valve sclerosis/calcification is present, without any evidence of aortic stenosis. Pulmonic Valve: The pulmonic valve was normal in structure. Pulmonic valve regurgitation is trivial. Aorta: The aortic root and ascending aorta are structurally normal, with no evidence of  dilitation. IAS/Shunts: No atrial level shunt detected by color flow Doppler.  LEFT VENTRICLE PLAX 2D LVIDd:         4.70 cm   Diastology LVIDs:         3.10 cm   LV e' medial:    9.68 cm/s LV PW:         1.10 cm   LV E/e' medial:  10.4 LV IVS:        1.00 cm   LV e' lateral:   10.90 cm/s LVOT diam:     1.90 cm   LV E/e' lateral: 9.3 LV SV:  67 LV SV Index:   35 LVOT Area:     2.84 cm  RIGHT VENTRICLE RV S prime:     10.10 cm/s TAPSE (M-mode): 2.4 cm LEFT ATRIUM             Index        RIGHT ATRIUM          Index LA diam:        3.70 cm 1.90 cm/m   RA Area:     8.93 cm LA Vol (A2C):   54.4 ml 27.95 ml/m  RA Volume:   18.30 ml 9.40 ml/m LA Vol (A4C):   39.3 ml 20.19 ml/m LA Biplane Vol: 50.3 ml 25.84 ml/m  AORTIC VALVE LVOT Vmax:   110.00 cm/s LVOT Vmean:  74.500 cm/s LVOT VTI:    0.238 m  AORTA Ao Root diam: 2.50 cm Ao Asc diam:  3.00 cm MITRAL VALVE MV Area (PHT): 4.15 cm     SHUNTS MV Decel Time: 183 msec     Systemic VTI:  0.24 m MV E velocity: 101.00 cm/s  Systemic Diam: 1.90 cm MV A velocity: 109.00 cm/s MV E/A ratio:  0.93 Gwyndolyn Kaufman MD Electronically signed by Gwyndolyn Kaufman MD Signature Date/Time: 11/26/2021/4:39:26 PM    Final     Disposition   Pt is being discharged home today in good condition.  Follow-up Plans & Appointments     Discharge Instructions     Amb Referral to Cardiac Rehabilitation   Complete by: As directed    Diagnosis: Coronary Stents   After initial evaluation and assessments completed: Virtual Based Care may be provided alone or in conjunction with Phase 2 Cardiac Rehab based on patient barriers.: Yes   Diet - low sodium heart healthy   Complete by: As directed    Increase activity slowly   Complete by: As directed         Discharge Medications   Allergies as of 12/11/2021       Reactions   Penicillins Swelling   REACTION: causes face swelling        Medication List     STOP taking these medications    metoprolol tartrate  100 MG tablet Commonly known as: LOPRESSOR       TAKE these medications    aspirin EC 81 MG tablet Take 81 mg by mouth daily.   atorvastatin 80 MG tablet Commonly known as: Lipitor Take 1 tablet (80 mg total) by mouth daily. What changed:  medication strength how much to take when to take this   cetirizine 10 MG tablet Commonly known as: ZYRTEC Take 10 mg by mouth daily.   clopidogrel 75 MG tablet Commonly known as: PLAVIX Take 1 tablet (75 mg total) by mouth daily with breakfast. Start taking on: December 12, 2021   furosemide 20 MG tablet Commonly known as: LASIX Take 20 mg by mouth daily.   gabapentin 300 MG capsule Commonly known as: NEURONTIN Take 300 mg by mouth every morning.   glimepiride 4 MG tablet Commonly known as: AMARYL Take 4 mg by mouth daily with breakfast.   isosorbide mononitrate 30 MG 24 hr tablet Commonly known as: IMDUR Take 1 tablet (30 mg total) by mouth daily.   metFORMIN 500 MG tablet Commonly known as: GLUCOPHAGE Take 2 tablets (1,000 mg total) by mouth 2 (two) times daily with a meal. Start taking on: December 13, 2021 What changed: These instructions start on December 13, 2021. If you are unsure what  to do until then, ask your doctor or other care provider.   metoprolol succinate 25 MG 24 hr tablet Commonly known as: TOPROL-XL Take 25 mg by mouth daily.   multivitamin with minerals tablet Take 1 tablet by mouth daily.   nitroGLYCERIN 0.4 MG SL tablet Commonly known as: NITROSTAT Place 1 tablet (0.4 mg total) under the tongue every 5 (five) minutes as needed for chest pain.   quinapril-hydrochlorothiazide 20-12.5 MG tablet Commonly known as: ACCURETIC Take 1 tablet by mouth daily.   vitamin B-12 50 MCG tablet Commonly known as: CYANOCOBALAMIN Take 50 mcg by mouth daily.          Allergies Allergies  Allergen Reactions   Penicillins Swelling    REACTION: causes face swelling    Outstanding Labs/Studies    Lipid panel and LFTS in 8 weeks   Duration of Discharge Encounter   Greater than 30 minutes including physician time.  SignedLeanor Kail, PA 12/11/2021, 3:37 PM   I have examined the patient and reviewed assessment and plan and discussed with patient.  Agree with above as stated.  Stay on DAPT.  Needs medical therapy for residual CAD. Diffuse moderate plaque throughout coronary tree, particularly small vessels.    Larae Grooms

## 2021-12-11 NOTE — Progress Notes (Signed)
Pt ambulated without difficulty or bleeding.   Discharged home with sister who will drive and stay with pt x 24 hrs 

## 2021-12-11 NOTE — Progress Notes (Signed)
CARDIAC REHAB PHASE I   Stent education completed with pt. Pt educated on importance of ASA and Plavix. Pt given stent card along with heart healthy and diabetic diets. Reviewed site care, restrictions, and exercise guidelines. Will refer to CRP II [place].  [id number] Rufina Falco, RN BSN 12/11/2021 11:44 AM

## 2021-12-11 NOTE — Discharge Instructions (Addendum)
Hold metformin for 2 days. Resume 12/14/21.    Radial Site Care  This sheet gives you information about how to care for yourself after your procedure. Your health care provider may also give you more specific instructions. If you have problems or questions, contact your health care provider. What can I expect after the procedure? After the procedure, it is common to have: Bruising and tenderness at the catheter insertion area. Follow these instructions at home: Medicines Take over-the-counter and prescription medicines only as told by your health care provider. Insertion site care Follow instructions from your health care provider about how to take care of your insertion site. Make sure you: Wash your hands with soap and water before you remove your bandage (dressing). If soap and water are not available, use hand sanitizer. May remove dressing in 24 hours. Check your insertion site every day for signs of infection. Check for: Redness, swelling, or pain. Fluid or blood. Pus or a bad smell. Warmth. Do no take baths, swim, or use a hot tub for 5 days. You may shower 24-48 hours after the procedure. Remove the dressing and gently wash the site with plain soap and water. Pat the area dry with a clean towel. Do not rub the site. That could cause bleeding. Do not apply powder or lotion to the site. Activity  For 24 hours after the procedure, or as directed by your health care provider: Do not flex or bend the affected arm. Do not push or pull heavy objects with the affected arm. Do not drive yourself home from the hospital or clinic. You may drive 24 hours after the procedure. Do not operate machinery or power tools. KEEP ARM ELEVATED THE REMAINDER OF THE DAY. Do not push, pull or lift anything that is heavier than 10 lb for 5 days. Ask your health care provider when it is okay to: Return to work or school. Resume usual physical activities or sports. Resume sexual activity. General  instructions If the catheter site starts to bleed, raise your arm and put firm pressure on the site. If the bleeding does not stop, get help right away. This is a medical emergency. DRINK PLENTY OF FLUIDS FOR THE NEXT 2-3 DAYS. No alcohol consumption for 24 hours after receiving sedation. If you went home on the same day as your procedure, a responsible adult should be with you for the first 24 hours after you arrive home. Keep all follow-up visits as told by your health care provider. This is important. Contact a health care provider if: You have a fever. You have redness, swelling, or yellow drainage around your insertion site. Get help right away if: You have unusual pain at the radial site. The catheter insertion area swells very fast. The insertion area is bleeding, and the bleeding does not stop when you hold steady pressure on the area. Your arm or hand becomes pale, cool, tingly, or numb. These symptoms may represent a serious problem that is an emergency. Do not wait to see if the symptoms will go away. Get medical help right away. Call your local emergency services (911 in the [place] Do not drive yourself to the hospital. Summary After the procedure, it is common to have bruising and tenderness at the site. Follow instructions from your health care provider about how to take care of your radial site wound. Check the wound every day for signs of infection.  This information is not intended to replace advice given to you by your health care  provider. Make sure you discuss any questions you have with your health care provider. °Document Revised: 01/07/2018 Document Reviewed: 01/07/2018 °Elsevier Patient Education © 2020 [employer]  °

## 2021-12-12 ENCOUNTER — Encounter (HOSPITAL_COMMUNITY): Payer: Self-pay | Admitting: Interventional Cardiology

## 2021-12-13 ENCOUNTER — Telehealth: Payer: Self-pay | Admitting: Interventional Cardiology

## 2021-12-13 NOTE — Telephone Encounter (Addendum)
Patient stated that after her heart cath with PCI on 12/13/21 she told the staff that something was bothering/hurting her back in the middle. She also noted that they had her take two pills right before her procedure and she feels like she didn't drink enough water. This has been occurring since she left the hospital. The pain occurs mostly after she eats but yesterday 12/28 when she woke up she noticed the pain. After she swallows food she feels like she get a knot in her back that causes pain and then it slowing eases from a 10 to a 5, this takes about  ~30 minutes to occur and the pain never fully goes away.  Current 121/60 80. The procedure site is healing good according to the patient. No missed doses of medications. The patient has not used any medication to try and relieve the pain. Spoke to Dr. Angelena Form in office and he feels like this is not heart related and advised to call PCP for GI symptoms.   Patient verbalized understand and agreement.

## 2021-12-13 NOTE — Telephone Encounter (Signed)
Patient called and said that she has been expereincing back pain. Patient recently had a stent put in

## 2021-12-14 ENCOUNTER — Telehealth (HOSPITAL_COMMUNITY): Payer: Self-pay

## 2021-12-14 NOTE — Telephone Encounter (Signed)
Pt is not interested in the cardiac rehab program. Closed referral 

## 2021-12-20 ENCOUNTER — Other Ambulatory Visit (HOSPITAL_COMMUNITY): Payer: Self-pay

## 2021-12-20 ENCOUNTER — Telehealth (HOSPITAL_COMMUNITY): Payer: Self-pay | Admitting: Pharmacist

## 2021-12-26 ENCOUNTER — Telehealth: Payer: Self-pay

## 2021-12-26 NOTE — Telephone Encounter (Signed)
Pt is calling in concerning her follow up appt with Jeralyn Ruths, NP. When pt was original scheduled she was with Dr. Sharlet Salina 09/13/21 11:00 the cancel reason states Provider. Pt then saw Jeralyn Ruths, NP and has a follow up appt with her for 12/27/21 but she is requesting to see Dr. Sharlet Salina.   Pt needs to be rescheduled for a 3 mth f/u upon approved from Dr. Sharlet Salina.   Pt also states her medications will need to be refilled by her provider at our location as her pervious provider will no longer forefill that quest as she is no longer under their care.  Pt is needing a refill on medication: quinapril-hydrochlorothiazide (ACCURETIC) 20-12.5 MG tablet  Or an alternate medication for BP.  Pharmacy:  [facility] on [address] and [place].

## 2021-12-27 ENCOUNTER — Other Ambulatory Visit: Payer: Self-pay | Admitting: Nurse Practitioner

## 2021-12-27 ENCOUNTER — Ambulatory Visit: Payer: 59 | Admitting: Nurse Practitioner

## 2021-12-27 DIAGNOSIS — I10 Essential (primary) hypertension: Secondary | ICD-10-CM

## 2021-12-27 MED ORDER — QUINAPRIL-HYDROCHLOROTHIAZIDE 20-12.5 MG PO TABS: 1.0000 | ORAL_TABLET | Freq: Every day | ORAL | 1 refills | Status: DC

## 2021-12-27 MED ORDER — LISINOPRIL-HYDROCHLOROTHIAZIDE 20-12.5 MG PO TABS: 1.0000 | ORAL_TABLET | Freq: Every day | ORAL | 0 refills | Status: DC

## 2021-12-27 NOTE — Telephone Encounter (Signed)
She did establish care with Jeralyn Ruths so can follow up with her. If she is wanting to switch providers would need approval from both providers prior to [facility] visit being scheduled.

## 2021-12-27 NOTE — Telephone Encounter (Signed)
Pt is wanting to establish care with Dr. Sharlet Salina but the new pt appt was cx and she saw Jeralyn Ruths, NP for her first appt with the practice.    Is the TOC acceptable?

## 2021-12-27 NOTE — Telephone Encounter (Signed)
Thank you I will let her know when I call her back.

## 2021-12-27 NOTE — Progress Notes (Signed)
Office Visit    Patient Name: [name] Washington Date of Encounter: 12/28/2021  PCP:  Ailene Ards, NP   [place]  Cardiologist:  Larae Grooms, MD  Advanced Practice Provider:  No care team member to display Electrophysiologist:  None      Chief Complaint    [name] Washington is a 61 y.o. female with a hx of hypertension, hyperlipidemia, OSA on CPAP, DM2, CAD s/p DES to RCA presents today for follow-up after PCI  Past Medical History    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    DIABETES MELLITUS    Esophageal reflux    HYPERLIPIDEMIA, MILD    HYPERTENSION    Osteoarthritis    Past Surgical History:  Procedure Laterality Date   ABDOMINAL HYSTERECTOMY     partial   CHOLECYSTECTOMY     COLONOSCOPY WITH PROPOFOL N/A 08/15/2014   Procedure: COLONOSCOPY WITH PROPOFOL;  Surgeon: Garlan Fair, MD;  Location: [facility];  Service: Endoscopy;  Laterality: N/A;   CORONARY STENT INTERVENTION N/A 12/11/2021   Procedure: CORONARY STENT INTERVENTION;  Surgeon: Jettie Booze, MD;  Location: [facility];  Service: Cardiovascular;  Laterality: N/A;   INTRAVASCULAR ULTRASOUND/IVUS N/A 12/11/2021   Procedure: Intravascular Ultrasound/IVUS;  Surgeon: Jettie Booze, MD;  Location: [facility];  Service: Cardiovascular;  Laterality: N/A;   LEFT HEART CATH AND CORONARY ANGIOGRAPHY N/A 12/11/2021   Procedure: LEFT HEART CATH AND CORONARY ANGIOGRAPHY;  Surgeon: Jettie Booze, MD;  Location: [facility];  Service: Cardiovascular;  Laterality: N/A;   SALPINGOOPHORECTOMY Right     Allergies  Allergies  Allergen Reactions   Penicillins Swelling    REACTION: causes face swelling    History of Present Illness    [name] Washington is a 61 y.o. female with a hx of hypertension, hyperlipidemia, OSA on CPAP, DM2, CAD s/p DES to RCA  last seen  for cardiac catheterization 12/11/2021.  Established with Dr. Irish Lack  due to chest pain as well as dyspnea with fatigue.  Coronary CT showed severe LAD and distal LCx stenosis.  Echocardiogram with normal LVEF.  Cardiac catheterization was arranged for further evaluation.  Cardiac catheterization.  27/22 with 80% P RCA stenosis s/p PTCA and DES.  Recommended for medical therapy for other nonobstructive disease.  Plan for DAPT with aspirin and Plavix.  Plan to transition to Plavix monotherapy after 6 to 12 months of DAPT.  Lipitor was increased from 20 mg to 80 mg daily.  She presents today for follow-up. 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 SHe is walking regularly in her neighborhood 10+ minutes per day with no exertional chest pain nor dyspnea. She had some musculoskeletal chest pain last week after bending for significant amount of time changing her son's diaper. It resolved with tylenol. Reports no shortness of breath nor dyspnea on exertion. No  orthopnea, PND. Reports no palpitations.  Mild left ankle edema which is stable compared to her baseline due to previous injury.  EKGs/Labs/Other Studies Reviewed:   The following studies were reviewed today:  Cardiac Catheterization 12/11/2021:   CORONARY STENT INTERVENTION  Intravascular Ultrasound/IVUS  LEFT HEART CATH AND CORONARY ANGIOGRAPHY  Conclusion       Mid Cx lesion is 25% stenosed.   Mid LAD lesion is 50% stenosed.   Mid RCA lesion is 40% stenosed.   2nd Diag lesion is 50% stenosed.   Prox RCA lesion is 80% stenosed.   A drug-eluting stent was successfully placed using a STENT ONYX FRONTIER [id number], and optimized with IVUS.   Post intervention, there is a 0% residual stenosis.   RPAV lesion is 70% stenosed.   RPDA lesion is 50% stenosed.   The left ventricular systolic  function is normal.   LV end diastolic pressure is normal.   The left ventricular ejection fraction is 55-65% by visual estimate.   There is no aortic valve stenosis.   Diffuse, calcific atherosclerotic disease.  This is most notable throughout the LAD.  In the midportion, there is a focal, eccentric 50% area of stenosis after a long, diffuse area of calcification.     The distal RCA branches also have a moderate amount of plaque diffusely, consistent with her diabetes.  The circumflex is her largest vessel and is widely patent.   Continue dual antiplatelet therapy.  Plan for clopidogrel monotherapy after her 6 to 12 months of dual antiplatelet therapy.  She will need aggressive medical therapy including high intensity statin and potentially other lipid-lowering options depending on how she responds.  She will need healthy diet, regular exercise and aggressive diabetes control.   Diagnostic Dominance: Right Intervention     EKG:  EKG is ordered today.  The ekg ordered today demonstrates NSR 60 bpm with no acute ST/T wave changes.   Recent Labs: 09/21/2021: ALT 25; TSH 2.14 11/26/2021: BUN 10; Creatinine, Ser 0.84; Hemoglobin 11.5; Platelets 339; Potassium 4.4; Sodium 143  Recent Lipid Panel    Component Value Date/Time   CHOL 139 09/21/2021 1105   TRIG 128.0 09/21/2021 1105   HDL 49.40 09/21/2021 1105   CHOLHDL 3 09/21/2021 1105   VLDL 25.6 09/21/2021 1105   LDLCALC 64 09/21/2021 1105    Home Medications   Current Meds  Medication Sig   aspirin EC 81 MG tablet Take 81 mg by mouth daily.   atorvastatin (LIPITOR) 80 MG tablet Take 1 tablet (80 mg total) by mouth daily.   cetirizine (ZYRTEC) 10 MG tablet Take 10 mg by mouth daily.   clopidogrel (PLAVIX) 75 MG tablet Take 1 tablet (75 mg total) by mouth daily with breakfast.   furosemide (LASIX) 20 MG tablet Take 20 mg by mouth daily.   gabapentin (NEURONTIN) 300 MG capsule Take 300 mg by mouth every morning.    glimepiride  (AMARYL) 4 MG tablet Take 4 mg by mouth daily with breakfast.   isosorbide mononitrate (IMDUR) 30 MG 24 hr tablet Take 1 tablet (30 mg total) by mouth daily.   lisinopril-hydrochlorothiazide (ZESTORETIC) 20-12.5 MG tablet Take 1 tablet by mouth daily.   metFORMIN (GLUCOPHAGE) 500 MG tablet Take 2 tablets (1,000 mg total) by mouth 2 (two) times daily with a meal.   metoprolol succinate (TOPROL-XL) 25 MG 24 hr tablet Take 25 mg by mouth daily.   Multiple Vitamins-Minerals (MULTIVITAMIN WITH MINERALS) tablet Take 1 tablet by mouth daily.   nitroGLYCERIN (NITROSTAT) 0.4 MG SL tablet Place 1 tablet (0.4 mg total) under the tongue every 5 (five) minutes as needed for chest pain.   vitamin B-12 (CYANOCOBALAMIN) 50 MCG tablet Take 50 mcg by mouth daily.     Review of Systems      All other systems reviewed  and are otherwise negative except as noted above.  Physical Exam    VS:  BP 138/86    Pulse 60    Ht 4\' 11"  (1.499 m)    Wt 232 lb 6.4 oz (105.4 kg)    BMI 46.94 kg/m  , BMI Body mass index is 46.94 kg/m.  Wt Readings from Last 3 Encounters:  12/28/21 232 lb 6.4 oz (105.4 kg)  12/11/21 228 lb (103.4 kg)  11/26/21 228 lb (103.4 kg)     GEN: Well nourished, well developed, in no acute distress. HEENT: normal. Neck: Supple, no JVD, carotid bruits, or masses. Cardiac: RRR, no murmurs, rubs, or gallops. No clubbing, cyanosis, edema.  Radials/PT 2+ and equal bilaterally.  Respiratory:  Respirations regular and unlabored, clear to auscultation bilaterally. GI: Soft, nontender, nondistended. MS: No deformity or atrophy. Skin: Warm and dry, no rash. Neuro:  Strength and sensation are intact. Psych: Normal affect.  Assessment & Plan    CAD - Recent DES to RCA. Stable with no anginal symptoms. No indication for ischemic evaluation.  GDMT includes aspirin, Plavix, Metoprolol, Atorvastatin, Imdur, PRN nitroglycerin. Heart healthy diet and regular cardiovascular exercise encouraged.  Encouraged to  continue her walking regimen.   Hypertension - BP mildly elevated but has not yet taken her blood pressure medications today. PCP recently transitioned her from benazepril-HCTZ to lisinopril-HCTZ due to insurance coverage. Encouraged home monitoring of blood pressures.   Hyperlipidemia, LDL goal <70 - Continue Atorvastatin 80mg  QD. Dose increased during recent admission. Plan for FLP/LFT in 6 weeks.   DM2 - Continue to follow with PCP.   Caregiver burden - Cares for her husband with muscular dystrophy and son who is special needs. Both are wheelchair bound. Will reach out to our SW team to see if they are aware of additional resources for the patient.  Disposition: Follow up in 3 month(s) with Larae Grooms, MD or APP.  Signed, Loel Dubonnet, NP 12/28/2021, 9:21 AM [place]

## 2021-12-27 NOTE — Telephone Encounter (Signed)
Pt is states that she had some of that medication from another provider but wanted to know that it was ok to take. I advised the pt of Jeralyn Ruths, NP recommendations for the follow up and pt has declined. Pt states that she will seek to find another provider bc she wants to see a Dr.   I advised pt that when Dr. Sharlet Salina responds I will give her a call back but she states she wants a doctor to manage her care.

## 2021-12-27 NOTE — Progress Notes (Signed)
Due to pharmacy no having quinapril-hydrochlorothiazide, changing prescription to lisinopril-hydrochlorothiazide.  Did discuss with pharmacist regarding dosages.  Will order lisinopril 20 mg and hydrochlorothiazide 12.5 mg in combination tablet.  May consider changing to quinapril 20 mg and hydrochlorothiazide 12.5 mg and separate tablets if patient does not tolerate lisinopril-hydrochlorothiazide combination.

## 2021-12-27 NOTE — Telephone Encounter (Signed)
[place] called and stated that: quinapril-hydrochlorothiazide (ACCURETIC) 20-12.5 MG tablet Is no longer available and that something else would need to be called in the place of that medication.  Please advise

## 2021-12-28 ENCOUNTER — Ambulatory Visit (HOSPITAL_BASED_OUTPATIENT_CLINIC_OR_DEPARTMENT_OTHER): Payer: Managed Care, Other (non HMO) | Admitting: Family

## 2021-12-28 ENCOUNTER — Other Ambulatory Visit: Payer: Self-pay

## 2021-12-28 ENCOUNTER — Encounter (HOSPITAL_BASED_OUTPATIENT_CLINIC_OR_DEPARTMENT_OTHER): Payer: Self-pay | Admitting: Family

## 2021-12-28 VITALS — BP 138/86 | HR 60 | Ht 59.0 in | Wt 232.4 lb

## 2021-12-28 DIAGNOSIS — I25118 Atherosclerotic heart disease of native coronary artery with other forms of angina pectoris: Secondary | ICD-10-CM

## 2021-12-28 DIAGNOSIS — E785 Hyperlipidemia, unspecified: Secondary | ICD-10-CM | POA: Diagnosis not present

## 2021-12-28 DIAGNOSIS — I10 Essential (primary) hypertension: Secondary | ICD-10-CM

## 2021-12-28 DIAGNOSIS — E119 Type 2 diabetes mellitus without complications: Secondary | ICD-10-CM

## 2021-12-28 DIAGNOSIS — Z636 Dependent relative needing care at home: Secondary | ICD-10-CM

## 2021-12-28 NOTE — Patient Instructions (Signed)
Medication Instructions:  Continue your current medications.   *If you need a refill on your cardiac medications before your next appointment, please call your pharmacy*   Lab Work: Your physician recommends that you return for lab work in 6 weeks for fasting lipid panel and CMET.  Your provider has recommended lab work. Please have this collected at [facility] at [place]. The lab is open 8:00 am - 4:30 pm. Please avoid 12:00p - 1:00p for lunch hour. You do not need an appointment. Please go to [address] [place], [place] [address]. This is in the Primary Care office on the 3rd floor, let them know you are there for blood work and they will direct you to the lab.   You may also have labs at [facility].   If you have labs (blood work) drawn today and your tests are completely normal, you will receive your results only by: [place] (if you have MyChart) OR A paper copy in the mail If you have any lab test that is abnormal or we need to change your treatment, we will call you to review the results.   Testing/Procedures: Your EKG today shows normal sinus rhythm which is a great result!   Follow-Up: At [facility], you and your health needs are our priority.  As part of our continuing mission to provide you with exceptional heart care, we have created designated Provider Care Teams.  These Care Teams include your primary Cardiologist (physician) and Advanced Practice Providers (APPs -  Physician Assistants and Nurse Practitioners) who all work together to provide you with the care you need, when you need it.  We recommend signing up for the patient portal called "MyChart".  Sign up information is provided on this After Visit Summary.  MyChart is used to connect with patients for Virtual Visits (Telemedicine).  Patients are able to view lab/test results, encounter notes, upcoming appointments, etc.  Non-urgent messages can be sent to your provider as well.    To learn more about what you can do with MyChart, go to [email or web address].    Your next appointment:   04/29/22 at [place] with Dr. Irish Lack at [facility]   Other Instructions  To prevent or reduce lower extremity swelling: Eat a low salt diet. Salt makes the body hold onto extra fluid which causes swelling. Sit with legs elevated. For example, in the recliner or on an [place].  Wear knee-high compression stockings during the daytime. Ones labeled 15-20 mmHg provide good compression.   For coronary artery disease often called "heart disease" we aim for optimal guideline directed medical therapy. We use the "A, B, C"s to help keep [place] on track!  A = Aspirin 81mg  daily B = Beta blocker which helps to relax the heart. This is your Metoprolol. C = Cholesterol control. You take Atorvastatin to help control your cholesterol.  D = Don't forget nitroglycerin! This is an emergency tablet to be used if you have chest pain. E = Extras. In your case, this is Plavix which helps protect your stent.   Heart Healthy Diet Recommendations: A low-salt diet is recommended. Meats should be grilled, baked, or boiled. Avoid fried foods. Focus on lean protein sources like fish or chicken with vegetables and fruits. The American Heart Association is a Microbiologist!  American Heart Association Diet and Lifeystyle Recommendations    Exercise recommendations: The American Heart Association recommends 150 minutes of moderate intensity exercise weekly. Try 30 minutes of moderate intensity  exercise 4-5 times per week. This could include walking, jogging, or swimming.   Heart-Healthy Eating Plan Heart-healthy meal planning includes: Eating less unhealthy fats. Eating more healthy fats. Making other changes in your diet. Talk with your doctor or a diet specialist (dietitian) to create an eating plan that is right for you.  What are tips for following this plan? Cooking Avoid frying your food.  Try to bake, boil, grill, or broil it instead. You can also reduce fat by: Removing the skin from poultry. Removing all visible fats from meats. Steaming vegetables in water or broth. Meal planning  At meals, divide your plate into four equal parts: Fill one-half of your plate with vegetables and green salads. Fill one-fourth of your plate with whole grains. Fill one-fourth of your plate with lean protein foods. Eat 4-5 servings of vegetables per day. A serving of vegetables is: 1 cup of raw or cooked vegetables. 2 cups of raw leafy greens. Eat 4-5 servings of fruit per day. A serving of fruit is: 1 medium whole fruit.  cup of dried fruit.  cup of fresh, frozen, or canned fruit.  cup of 100% fruit juice. Eat more foods that have soluble fiber. These are apples, broccoli, carrots, beans, peas, and barley. Try to get 20-30 g of fiber per day. Eat 4-5 servings of nuts, legumes, and seeds per week: 1 serving of dried beans or legumes equals  cup after being cooked. 1 serving of nuts is  cup. 1 serving of seeds equals 1 tablespoon. General information Eat more home-cooked food. Eat less restaurant, buffet, and fast food. Limit or avoid alcohol. Limit foods that are high in starch and sugar. Avoid fried foods. Lose weight if you are overweight. Keep track of how much salt (sodium) you eat. This is important if you have high blood pressure. Ask your doctor to tell you more about this. Try to add vegetarian meals each week. Fats Choose healthy fats. These include olive oil and canola oil, flaxseeds, walnuts, almonds, and seeds. Eat more omega-3 fats. These include salmon, mackerel, sardines, tuna, flaxseed oil, and ground flaxseeds. Try to eat fish at least 2 times each week. Check food labels. Avoid foods with trans fats or high amounts of saturated fat. Limit saturated fats. These are often found in animal products, such as meats, butter, and cream. These are also found in plant  foods, such as palm oil, palm kernel oil, and coconut oil. Avoid foods with partially hydrogenated oils in them. These have trans fats. Examples are stick margarine, some tub margarines, cookies, crackers, and other baked goods. What foods can I eat? Fruits All fresh, canned (in natural juice), or frozen fruits. Vegetables Fresh or frozen vegetables (raw, steamed, roasted, or grilled). Green salads. Grains Most grains. Choose whole wheat and whole grains most of the time. Rice and pasta, including brown rice and pastas made with whole wheat. Meats and other proteins Lean, well-trimmed beef, veal, pork, and lamb. Chicken and [place] without skin. All fish and shellfish. Wild duck, rabbit, pheasant, and venison. Egg whites or low-cholesterol egg substitutes. Dried beans, peas, lentils, and tofu. Seeds and most nuts. Dairy Low-fat or nonfat cheeses, including ricotta and mozzarella. Skim or 1% milk that is liquid, powdered, or evaporated. Buttermilk that is made with low-fat milk. Nonfat or low-fat yogurt. Fats and oils Non-hydrogenated (trans-free) margarines. Vegetable oils, including soybean, sesame, sunflower, olive, peanut, safflower, corn, canola, and cottonseed. Salad dressings or mayonnaise made with a vegetable oil. Beverages Mineral water. Coffee  and tea. Diet carbonated beverages. Sweets and desserts Sherbet, gelatin, and fruit ice. Small amounts of dark chocolate. Limit all sweets and desserts. Seasonings and condiments All seasonings and condiments. The items listed above may not be a complete list of foods and drinks you can eat. Contact a dietitian for more options. What foods should I avoid? Fruits Canned fruit in heavy syrup. Fruit in cream or butter sauce. Fried fruit. Limit coconut. Vegetables Vegetables cooked in cheese, cream, or butter sauce. Fried vegetables. Grains Breads that are made with saturated or trans fats, oils, or whole milk. Croissants. Sweet rolls. Donuts.  High-fat crackers, such as cheese crackers. Meats and other proteins Fatty meats, such as hot dogs, ribs, sausage, bacon, rib-eye roast or steak. High-fat deli meats, such as salami and bologna. Caviar. Domestic duck and goose. Organ meats, such as liver. Dairy Cream, sour cream, cream cheese, and creamed cottage cheese. Whole-milk cheeses. Whole or 2% milk that is liquid, evaporated, or condensed. Whole buttermilk. Cream sauce or high-fat cheese sauce. Yogurt that is made from whole milk. Fats and oils Meat fat, or shortening. Cocoa butter, hydrogenated oils, palm oil, coconut oil, palm kernel oil. Solid fats and shortenings, including bacon fat, salt pork, lard, and butter. Nondairy cream substitutes. Salad dressings with cheese or sour cream. Beverages Regular sodas and juice drinks with added sugar. Sweets and desserts Frosting. Pudding. Cookies. Cakes. Pies. Milk chocolate or white chocolate. Buttered syrups. Full-fat ice cream or ice cream drinks. The items listed above may not be a complete list of foods and drinks to avoid. Contact a dietitian for more information. Summary Heart-healthy meal planning includes eating less unhealthy fats, eating more healthy fats, and making other changes in your diet. Eat a balanced diet. This includes fruits and vegetables, low-fat or nonfat dairy, lean protein, nuts and legumes, whole grains, and heart-healthy oils and fats. This information is not intended to replace advice given to you by your health care provider. Make sure you discuss any questions you have with your health care provider. Document Revised: 04/12/2021 Document Reviewed: 04/12/2021 Elsevier Patient Education  2022 [employer].

## 2021-12-28 NOTE — Telephone Encounter (Signed)
If she is not interested in following up as directed for safety I am not sure I am able to establish her.

## 2021-12-31 ENCOUNTER — Telehealth: Payer: Self-pay | Admitting: Licensed Clinical Social Worker

## 2021-12-31 NOTE — Telephone Encounter (Signed)
I called pt to give her Jeralyn Ruths, NP recommendation and also Dr. Sharlet Salina stating if she wasn't willing to follow up she wasn't willing to establish care with her.   Pt was willing to schedule the follow up after hearing why Jeralyn Ruths, NP wanted her to F/U.  Pt appt with Jeralyn Ruths is 01/10/22.

## 2021-12-31 NOTE — Addendum Note (Signed)
Addended by: Gerald Stabs on: 12/31/2021 08:53 AM   Modules accepted: Orders

## 2021-12-31 NOTE — Progress Notes (Signed)
Heart and Vascular Care Navigation  12/31/2021  [name] Washington 03/11/1961 [id number]  Reason for Referral:  Caregiving support Engaged with patient by telephone for initial visit for Heart and Vascular Care Coordination.                                                                                                   Assessment:                                  LCSW reached out to pt this afternoon. Introduced self, role, reason for call. Pt confirmed home address, PCP and emergency contacts. 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 She shares that prior to [place] her son was much more active around the community with his programs and things he was doing. He has since mostly been home and she has been providing care. She is interested in programs and available supports. LCSW shared that I have mailed information about day programs as well as information about support for caregivers themselves. Pt very appreciative of this information and check in. No additional needs related to housing, transportation or cost of living at this time.   HRT/VAS Care Coordination     Patients Home Cardiology Office [place] Team Social Worker   Social Worker Name: Valeda Malm, [place] [phone]   Living arrangements for the past 2 months Single Family Home   Lives with: Spouse; Adult Children   Patient Current Librarian, academic   Patient Has Concern With Paying Medical Bills No   Does Patient Have Prescription Coverage? Yes       Social History:                                                                             SDOH Screenings   Alcohol Screen: Not on file  Depression (PHQ2-9): Low Risk    PHQ-2 Score: 0  Financial Resource Strain: Low Risk    Difficulty of Paying Living Expenses: Not very hard  Food Insecurity: No Food Insecurity   Worried About Charity fundraiser in the Last Year: Never true   Ran Out  of Food in the Last Year: Never true  Housing: Low Risk    Last Housing Risk Score: 0  Physical Activity: Not on file  Social Connections: Not on file  Stress: Not on file  Tobacco Use: Low Risk    Smoking Tobacco Use: Never   Smokeless Tobacco Use: Never   Passive Exposure: Not on file  Transportation Needs: No Transportation Needs   Lack of Transportation (Medical): No   Lack of Transportation (Non-Medical): No    SDOH Interventions: Financial Resources:  Financial Strain Interventions:  Intervention Not Indicated DSS for financial assistance  Food Insecurity:  Food Insecurity Interventions: Intervention Not Indicated  Housing Insecurity:  Housing Interventions: Intervention Not Indicated  Transportation:   Transportation Interventions: Intervention Not Indicated    Follow-up plan:   LCSW provided my name, number, and encouraged pt if when she received that information if she had any additional questions to let me know.

## 2022-01-10 ENCOUNTER — Other Ambulatory Visit: Payer: Self-pay

## 2022-01-10 ENCOUNTER — Encounter: Payer: Self-pay | Admitting: Nurse Practitioner

## 2022-01-10 ENCOUNTER — Ambulatory Visit (INDEPENDENT_AMBULATORY_CARE_PROVIDER_SITE_OTHER): Payer: Managed Care, Other (non HMO) | Admitting: Nurse Practitioner

## 2022-01-10 VITALS — BP 132/81 | HR 76 | Temp 98.3°F | Ht 59.0 in | Wt 223.0 lb

## 2022-01-10 DIAGNOSIS — I10 Essential (primary) hypertension: Secondary | ICD-10-CM | POA: Diagnosis not present

## 2022-01-10 LAB — BASIC METABOLIC PANEL
BUN: 15 mg/dL (ref 6–23)
CO2: 28 mEq/L (ref 19–32)
Calcium: 10 mg/dL (ref 8.4–10.5)
Chloride: 103 mEq/L (ref 96–112)
Creatinine, Ser: 0.89 mg/dL (ref 0.40–1.20)
GFR: 70.43 mL/min (ref >60.00)
Glucose, Bld: 92 mg/dL (ref 70–99)
Potassium: 3.9 mEq/L (ref 3.5–5.1)
Sodium: 139 mEq/L (ref 135–145)

## 2022-01-10 NOTE — Progress Notes (Signed)
Subjective:  Patient ID: [name] Washington, female    DOB: 1961-02-14  Age: 61 y.o. MRN: [id number]  CC:  Chief Complaint  Patient presents with   Follow-up   Hypertension      HPI  This patient arrives today for the above.  She was on quinapril-hydrochlorothiazide in the past but due to medication availability at pharmacy we had changed the lisinopril that hydrochlorothiazide.  She tells me she is tolerating the change well.  She has no negative side effects.  She does remain a bit fatigued, but otherwise is feeling well.  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    DIABETES MELLITUS    Esophageal reflux    HYPERLIPIDEMIA, MILD    HYPERTENSION    Osteoarthritis       Family History  Problem Relation Age of Onset   Hypertension Mother    Heart failure Father    Prostate cancer Father    Hypertension Father    Cancer Paternal Aunt     Social History   Social History Narrative   Drinks coffee, tea, soda daily    Social History   Tobacco Use   Smoking status: Never   Smokeless tobacco: Never  Substance Use Topics   Alcohol use: Yes    Alcohol/week: 0.0 standard drinks    Comment: occassionally beer     Current Meds  Medication Sig   aspirin EC 81 MG tablet Take 81 mg by mouth daily.   atorvastatin (LIPITOR) 80 MG tablet Take 1 tablet (80 mg total) by mouth daily.   cetirizine (ZYRTEC) 10 MG tablet Take 10 mg by mouth daily.   clopidogrel (PLAVIX) 75 MG tablet Take 1 tablet (75 mg total) by mouth daily with breakfast.   furosemide (LASIX) 20 MG tablet Take 20 mg by mouth daily.   gabapentin (NEURONTIN) 300 MG capsule Take 300 mg by mouth every morning.    glimepiride (AMARYL) 4 MG tablet Take 4 mg by mouth daily with breakfast.   isosorbide mononitrate (IMDUR) 30 MG 24 hr tablet Take 1 tablet (30 mg total) by mouth daily.   lisinopril-hydrochlorothiazide (ZESTORETIC)  20-12.5 MG tablet Take 1 tablet by mouth daily.   metFORMIN (GLUCOPHAGE) 500 MG tablet Take 2 tablets (1,000 mg total) by mouth 2 (two) times daily with a meal.   metoprolol succinate (TOPROL-XL) 25 MG 24 hr tablet Take 25 mg by mouth daily.   Multiple Vitamins-Minerals (MULTIVITAMIN WITH MINERALS) tablet Take 1 tablet by mouth daily.   nitroGLYCERIN (NITROSTAT) 0.4 MG SL tablet Place 1 tablet (0.4 mg total) under the tongue every 5 (five) minutes as needed for chest pain.   vitamin B-12 (CYANOCOBALAMIN) 50 MCG tablet Take 50 mcg by mouth daily.    ROS:  Review of Systems  Eyes:  Negative for blurred vision.  Respiratory:  Negative for shortness of breath.   Cardiovascular:  Negative for chest pain.  Neurological:  Negative for headaches.    Objective:   Today's Vitals: BP 132/81 (BP Location: Right Arm, Patient Position: Sitting, Cuff Size: Normal)    Pulse 76    Temp 98.3 F (36.8 C) (Oral)    Ht 4\' 11"  (1.499 m)    Wt 223 lb (101.2 kg)    SpO2 100%    BMI 45.04 kg/m  Vitals with BMI 01/10/2022 12/28/2021 12/11/2021  Height 4\' 11"  4\' 11"  -  Weight 223 lbs 232 lbs 6 oz -  BMI 35.46 56.81 -  Systolic 275 170 017  Diastolic 81 86 76  Pulse 76 60 70     Physical Exam Vitals reviewed.  Constitutional:      General: She is not in acute distress.    Appearance: Normal appearance.  HENT:     Head: Normocephalic and atraumatic.  Neck:     Vascular: No carotid bruit.  Cardiovascular:     Rate and Rhythm: Normal rate and regular rhythm.     Pulses: Normal pulses.     Heart sounds: Normal heart sounds.  Pulmonary:     Effort: Pulmonary effort is normal.     Breath sounds: Normal breath sounds.  Skin:    General: Skin is warm and dry.  Neurological:     General: No focal deficit present.     Mental Status: She is alert and oriented to person, place, and time.  Psychiatric:        Mood and Affect: Mood normal.        Behavior: Behavior normal.        Judgment: Judgment normal.          Assessment and Plan   1. Hypertension, unspecified type      Plan: 1.  She will continue on lisinopril-hydrochlorothiazide.  We will check BMP today to monitor electrolytes and kidney function, assuming everything is stable she will continue on lisinopril-hydrochlorothiazide.   Tests ordered Orders Placed This Encounter  Procedures   Basic Metabolic Panel (BMET)      No orders of the defined types were placed in this encounter.   Patient to follow-up in 3 to 6 months for routine follow-up or sooner as needed.  Ailene Ards, NP

## 2022-02-08 LAB — COMPREHENSIVE METABOLIC PANEL
ALT: 20 IU/L (ref 0–32)
AST: 16 IU/L (ref 0–40)
Albumin/Globulin Ratio: 1.6 (ref 1.2–2.2)
Albumin: 4.2 g/dL (ref 3.8–4.9)
Alkaline Phosphatase: 128 IU/L — ABNORMAL HIGH (ref 44–121)
BUN/Creatinine Ratio: 18 (ref 12–28)
BUN: 12 mg/dL (ref 8–27)
Bilirubin Total: <0.2 mg/dL (ref 0.0–1.2)
CO2: 24 mmol/L (ref 20–29)
Calcium: 9.8 mg/dL (ref 8.7–10.3)
Chloride: 107 mmol/L — ABNORMAL HIGH (ref 96–106)
Creatinine, Ser: 0.67 mg/dL (ref 0.57–1.00)
Globulin, Total: 2.6 g/dL (ref 1.5–4.5)
Glucose: 123 mg/dL — ABNORMAL HIGH (ref 70–99)
Potassium: 4.6 mmol/L (ref 3.5–5.2)
Sodium: 144 mmol/L (ref 134–144)
Total Protein: 6.8 g/dL (ref 6.0–8.5)
eGFR: 100 mL/min/{1.73_m2} (ref >59)

## 2022-02-08 LAB — LIPID PANEL
Chol/HDL Ratio: 2.9 ratio (ref 0.0–4.4)
Cholesterol, Total: 136 mg/dL (ref 100–199)
HDL: 47 mg/dL (ref >39)
LDL Chol Calc (NIH): 69 mg/dL (ref 0–99)
Triglycerides: 108 mg/dL (ref 0–149)
VLDL Cholesterol Cal: 20 mg/dL (ref 5–40)

## 2022-02-11 ENCOUNTER — Telehealth (HOSPITAL_BASED_OUTPATIENT_CLINIC_OR_DEPARTMENT_OTHER): Payer: Self-pay

## 2022-02-11 NOTE — Telephone Encounter (Addendum)
Results called to patient who verbalizes understanding!     ----- Message from Loel Dubonnet, NP sent at 02/11/2022  8:17 AM EST ----- Normal liver enzymes.  Alkaline phosphatase mildly elevated-recommend avoidance of fried foods, alcohol, Tylenol.  Normal kidney function.  Cholesterol panel shows all numbers at goal.  Continue current medications.

## 2022-02-12 ENCOUNTER — Telehealth: Payer: Self-pay

## 2022-02-12 LAB — HM MAMMOGRAPHY

## 2022-02-12 NOTE — Telephone Encounter (Signed)
Pt is requesting a refill on: glimepiride (AMARYL) 4 MG tablet furosemide (LASIX) 20 MG tablet metoprolol succinate (TOPROL-XL) 25 MG 24 hr tablet gabapentin (NEURONTIN) 300 MG capsule lisinopril-hydrochlorothiazide (ZESTORETIC) 20-12.5 MG tablet metFORMIN (GLUCOPHAGE) 500 MG tablet   Pharmacy: [facility], [address]  LOV 01/10/22  ROV 04/11/22  Pt CB [phone]

## 2022-02-13 NOTE — Telephone Encounter (Signed)
This will be her first fill with [place]. All medications except for Metformin which is prescribed by cardiology are listed as historical provider.  ? ?Ok to fill? ?

## 2022-02-14 ENCOUNTER — Other Ambulatory Visit: Payer: Self-pay | Admitting: Nurse Practitioner

## 2022-02-14 DIAGNOSIS — I10 Essential (primary) hypertension: Secondary | ICD-10-CM

## 2022-02-14 DIAGNOSIS — E1140 Type 2 diabetes mellitus with diabetic neuropathy, unspecified: Secondary | ICD-10-CM

## 2022-02-14 MED ORDER — GABAPENTIN 300 MG PO CAPS: 300.0000 mg | ORAL_CAPSULE | Freq: Every morning | ORAL | 0 refills | Status: DC

## 2022-02-14 MED ORDER — FUROSEMIDE 20 MG PO TABS: 20.0000 mg | ORAL_TABLET | Freq: Every day | ORAL | 0 refills | Status: DC

## 2022-02-14 MED ORDER — METOPROLOL SUCCINATE ER 25 MG PO TB24: 25.0000 mg | ORAL_TABLET | Freq: Every day | ORAL | 0 refills | Status: DC

## 2022-02-14 MED ORDER — LISINOPRIL-HYDROCHLOROTHIAZIDE 20-12.5 MG PO TABS: 1.0000 | ORAL_TABLET | Freq: Every day | ORAL | 0 refills | Status: DC

## 2022-02-14 MED ORDER — METFORMIN HCL 500 MG PO TABS: 1000.0000 mg | ORAL_TABLET | Freq: Two times a day (BID) | ORAL | 0 refills | Status: DC

## 2022-02-14 MED ORDER — GLIMEPIRIDE 4 MG PO TABS: 4.0000 mg | ORAL_TABLET | Freq: Every day | ORAL | 0 refills | Status: DC

## 2022-02-28 ENCOUNTER — Other Ambulatory Visit (HOSPITAL_COMMUNITY): Payer: Self-pay

## 2022-03-07 ENCOUNTER — Encounter: Payer: Self-pay | Admitting: Nurse Practitioner

## 2022-03-07 NOTE — Progress Notes (Signed)
Outside notes received. Information abstracted. Notes sent to scan.  

## 2022-04-11 ENCOUNTER — Ambulatory Visit (INDEPENDENT_AMBULATORY_CARE_PROVIDER_SITE_OTHER): Payer: Managed Care, Other (non HMO) | Admitting: Nurse Practitioner

## 2022-04-11 VITALS — BP 120/70 | HR 77 | Temp 98.3°F | Ht 59.0 in | Wt 224.0 lb

## 2022-04-11 DIAGNOSIS — I10 Essential (primary) hypertension: Secondary | ICD-10-CM | POA: Diagnosis not present

## 2022-04-11 DIAGNOSIS — E1140 Type 2 diabetes mellitus with diabetic neuropathy, unspecified: Secondary | ICD-10-CM | POA: Diagnosis not present

## 2022-04-11 DIAGNOSIS — R748 Abnormal levels of other serum enzymes: Secondary | ICD-10-CM | POA: Diagnosis not present

## 2022-04-11 DIAGNOSIS — I2510 Atherosclerotic heart disease of native coronary artery without angina pectoris: Secondary | ICD-10-CM | POA: Diagnosis not present

## 2022-04-11 LAB — MICROALBUMIN / CREATININE URINE RATIO
Creatinine,U: 60.9 mg/dL
Microalb Creat Ratio: 1.2 mg/g (ref 0.0–30.0)
Microalb, Ur: 0.7 mg/dL (ref 0.0–1.9)

## 2022-04-11 LAB — HEMOGLOBIN A1C: Hgb A1c MFr Bld: 7.9 % — ABNORMAL HIGH (ref 4.6–6.5)

## 2022-04-11 LAB — COMPREHENSIVE METABOLIC PANEL
ALT: 24 U/L (ref 0–35)
AST: 17 U/L (ref 0–37)
Albumin: 4.3 g/dL (ref 3.5–5.2)
Alkaline Phosphatase: 110 U/L (ref 39–117)
BUN: 10 mg/dL (ref 6–23)
CO2: 28 mEq/L (ref 19–32)
Calcium: 9.9 mg/dL (ref 8.4–10.5)
Chloride: 103 mEq/L (ref 96–112)
Creatinine, Ser: 0.67 mg/dL (ref 0.40–1.20)
GFR: 94.79 mL/min (ref >60.00)
Glucose, Bld: 75 mg/dL (ref 70–99)
Potassium: 4 mEq/L (ref 3.5–5.1)
Sodium: 138 mEq/L (ref 135–145)
Total Bilirubin: 0.4 mg/dL (ref 0.2–1.2)
Total Protein: 7.7 g/dL (ref 6.0–8.3)

## 2022-04-11 NOTE — Assessment & Plan Note (Addendum)
Chronic, stable, no reports of chest pain today.  She will continue to follow-up with cardiology as scheduled. Continue current meds zestoric 20-12.'5mg'$  by mouth daily, furosemide '20mg'$  by mouth daily, imdur 30 mg by mouth daily, metoprolol '25mg'$  by mouth daily, nitroglycerin as needed, atorvastatin 80 mg by mouth daily, clopidogrel 75 mg by mouth daily, aspirin 81 mg by mouth daily.  ? ?

## 2022-04-11 NOTE — Assessment & Plan Note (Addendum)
Chronic, suboptimally controlled.  Patient encouraged to work on improving diet, referral to dietitian made today.  We will check A1c as well as urine for microalbuminuria.  She will continue on glimepiride 4 mg by mouth daily, metformin 1000 mg by mouth twice a day.  She also continue on her Zestoretic which has ACE inhibitor and atorvastatin 80 mg a mouth daily.  Will refer to podiatry for assistance with evaluating her feet concerns.  May consider initiating SGLT2 such as [place] pending blood work results. ?

## 2022-04-11 NOTE — Assessment & Plan Note (Signed)
Recheck CMP today, consider ultrasound if levels remain high. ?

## 2022-04-11 NOTE — Progress Notes (Addendum)
? ? ? ?Subjective:  ?Patient ID: [name] Washington, female    DOB: 07/03/1961  Age: 61 y.o. MRN: [id number] ? ?CC:  ?Chief Complaint  ?Patient presents with  ? Follow-up  ? Diabetes  ?  ? ? ?HPI  ?This patient arrives today for the above. ? ?Diabetes: Last A1c was 7.8 collected in October 2022.  She continues on glimepiride 4 mg mouth daily and metformin 1000 milligrams by mouth twice a day.  She is also on ACE inhibitor and statin therapy.  She is due to have A1c checked today as well as have urine checked for albuminuria.  She reports that she has not been eating healthfully, and often eats out.  She would like to speak with a dietitian regarding dietary habit change.  She is also requesting referral to podiatry for routine diabetic foot exams, and she has a history of rash to her left foot (4th and 5th digit) that she thinks was tinea.  She treated with over-the-counter cream and rash is improved but she would like to be seen by podiatry for further evaluation. She denies pain to her foot. ? ?CAD: Underwent stent placement (DES to RCA in February 2022).  She continues following with cardiology and continues on DAPT, plan is to change to Plavix monotherapy after being on dual therapy for 6 to 12 months.  Additionally, her Lipitor was increased from 20 to 80 mg by mouth daily.  She also continues on metoprolol, Imdur, and as needed nitroglycerin.  She denies any chest pain today.  She reports she still gets shortness of breath with physical exertion at times. ? ?Elevated alkaline phosphatase: Alkaline phosphatase was elevated very slightly back in February 2023 at 128.  She denies any significant abdominal pain. ? ?Past Medical History:  ?Diagnosis Date  ? CHEST PAIN, ACUTE   ? Depression   ? DIAB W/NEURO MANIFESTS TYPE II/UNS NOT UNCNTRL   ? DIABETES MELLITUS   ? Esophageal reflux   ? HYPERLIPIDEMIA, MILD   ? HYPERTENSION   ? Osteoarthritis   ? ? ? ? ?Family History  ?Problem Relation Age of Onset  ? Hypertension  Mother   ? Heart failure Father   ? Prostate cancer Father   ? Hypertension Father   ? Cancer Paternal Aunt   ? ? ?Social History  ? ?Social History Narrative  ? Drinks coffee, tea, soda daily   ? ?Social History  ? ?Tobacco Use  ? Smoking status: Never  ? Smokeless tobacco: Never  ?Substance Use Topics  ? Alcohol use: Yes  ?  Alcohol/week: 0.0 standard drinks  ?  Comment: occassionally beer  ? ? ? ?Current Meds  ?Medication Sig  ? aspirin EC 81 MG tablet Take 81 mg by mouth daily.  ? atorvastatin (LIPITOR) 80 MG tablet Take 1 tablet (80 mg total) by mouth daily.  ? cetirizine (ZYRTEC) 10 MG tablet Take 10 mg by mouth daily.  ? clopidogrel (PLAVIX) 75 MG tablet Take 1 tablet (75 mg total) by mouth daily with breakfast.  ? furosemide (LASIX) 20 MG tablet Take 1 tablet (20 mg total) by mouth daily.  ? gabapentin (NEURONTIN) 300 MG capsule Take 1 capsule (300 mg total) by mouth every morning.  ? glimepiride (AMARYL) 4 MG tablet Take 1 tablet (4 mg total) by mouth daily with breakfast.  ? isosorbide mononitrate (IMDUR) 30 MG 24 hr tablet Take 1 tablet (30 mg total) by mouth daily.  ? lisinopril-hydrochlorothiazide (ZESTORETIC) 20-12.5 MG tablet Take 1 tablet  by mouth daily.  ? metFORMIN (GLUCOPHAGE) 500 MG tablet Take 2 tablets (1,000 mg total) by mouth 2 (two) times daily with a meal.  ? metoprolol succinate (TOPROL-XL) 25 MG 24 hr tablet Take 1 tablet (25 mg total) by mouth daily.  ? Multiple Vitamins-Minerals (MULTIVITAMIN WITH MINERALS) tablet Take 1 tablet by mouth daily.  ? nitroGLYCERIN (NITROSTAT) 0.4 MG SL tablet Place 1 tablet (0.4 mg total) under the tongue every 5 (five) minutes as needed for chest pain.  ? vitamin B-12 (CYANOCOBALAMIN) 50 MCG tablet Take 50 mcg by mouth daily.  ? ? ?ROS:  ?Review of Systems  ?Respiratory:  Negative for shortness of breath.   ?Cardiovascular:  Negative for chest pain.  ?Gastrointestinal:  Negative for abdominal pain.  ? ? ?Objective:  ? ?Today's Vitals: BP 120/70 (BP  Location: Right Arm, Patient Position: Sitting, Cuff Size: Large)   Pulse 77   Temp 98.3 ?F (36.8 ?C) (Oral)   Ht _0  (1.499 m)   Wt 224 lb (101.6 kg)   SpO2 96%   BMI 45.24 kg/m?  ? ?  04/11/2022  ?  1:04 PM 01/10/2022  ?  1:51 PM 12/28/2021  ?  8:38 AM  ?Vitals with BMI  ?Height _1  _2  _3   ?Weight 224 lbs 223 lbs 232 lbs 6 oz  ?BMI 45.22 45.02 46.91  ?Systolic 537 943 276  ?Diastolic 70 81 86  ?Pulse 77 76 60  ?  ? ?Physical Exam ?Vitals reviewed.  ?Constitutional:   ?   General: She is not in acute distress. ?   Appearance: Normal appearance.  ?HENT:  ?   Head: Normocephalic and atraumatic.  ?Neck:  ?   Vascular: No carotid bruit.  ?Cardiovascular:  ?   Rate and Rhythm: Normal rate and regular rhythm.  ?   Pulses: Normal pulses.  ?   Heart sounds: Normal heart sounds.  ?Pulmonary:  ?   Effort: Pulmonary effort is normal.  ?   Breath sounds: Normal breath sounds.  ?Skin: ?   General: Skin is warm and dry.  ?Neurological:  ?   General: No focal deficit present.  ?   Mental Status: She is alert and oriented to person, place, and time.  ?Psychiatric:     ?   Mood and Affect: Mood normal.     ?   Behavior: Behavior normal.     ?   Judgment: Judgment normal.  ? ? ? ? ? ? ? ?  04/11/2022  ?  1:07 PM 09/21/2021  ? 10:31 AM  ?PHQ9 SCORE ONLY  ?PHQ-9 Total Score 4 0  ? ? ?Assessment and Plan  ? ?1. Type 2 diabetes mellitus with diabetic neuropathy, without long-term current use of insulin ([place])   ?2. Elevated alkaline phosphatase level   ?3. Coronary artery disease involving native coronary artery of native heart, unspecified whether angina present   ?4. Essential hypertension   ? ? ? ?Plan: ?See plan via problem list below. ? ? ?Tests ordered ?Orders Placed This Encounter  ?Procedures  ? Comp Met (CMET)  ? HgB A1c  ? Microalbumin / creatinine urine ratio  ? Ambulatory referral to Podiatry  ? Amb ref to Medical Nutrition Therapy-MNT  ? ? ? ? ?No orders of the defined types were placed in this  encounter. ? ? ?Patient to follow-up in 3 months or sooner as needed. ? ?Ailene Ards, NP ? ?

## 2022-04-11 NOTE — Assessment & Plan Note (Signed)
Chronic, stable, well controlled. Continue current meds zestoric 20-12.'5mg'$  by mouth daily, furosemide '20mg'$  by mouth daily, imdur 30 mg by mouth daily, metoprolol '25mg'$  by mouth daily.  ?

## 2022-04-15 ENCOUNTER — Encounter: Payer: Self-pay | Admitting: Podiatry

## 2022-04-15 ENCOUNTER — Other Ambulatory Visit: Payer: Self-pay | Admitting: Nurse Practitioner

## 2022-04-15 ENCOUNTER — Ambulatory Visit: Payer: Managed Care, Other (non HMO) | Admitting: Podiatry

## 2022-04-15 DIAGNOSIS — E1142 Type 2 diabetes mellitus with diabetic polyneuropathy: Secondary | ICD-10-CM | POA: Diagnosis not present

## 2022-04-15 DIAGNOSIS — G8929 Other chronic pain: Secondary | ICD-10-CM

## 2022-04-15 DIAGNOSIS — E1140 Type 2 diabetes mellitus with diabetic neuropathy, unspecified: Secondary | ICD-10-CM

## 2022-04-15 DIAGNOSIS — M25572 Pain in left ankle and joints of left foot: Secondary | ICD-10-CM | POA: Diagnosis not present

## 2022-04-15 NOTE — Progress Notes (Signed)
This patient presents to the office for diabetic foot exam.  Patient was referred to the office by  her doctor. This patient says there is no pain or discomfort in her feet but left ankle pain.  No history of infection or drainage.  This patient presents to the office for foot exam due to having a history of diabetes. ? ?Vascular  Dorsalis pedis and posterior tibial pulses are not palpable due to swelling. B/L.  Capillary return  WNL.  Temperature gradient is  WNL.  Skin turgor  WNL ? ?Sensorium  Senn Weinstein monofilament wire  WNL. Normal tactile sensation. ? ?Nail Exam  Patient has normal nails with no evidence of bacterial or fungal infection. ? ?Orthopedic  Exam  Muscle tone and muscle strength  WNL.  No limitations of motion feet  B/L.  No crepitus or joint effusion noted.  Foot type is unremarkable and digits show no abnormalities.  Bony prominences are unremarkable.  Ankle pain left ankle on ROM.   ? ?Skin  No open lesions.  Normal skin texture and turgor.  ? ?Diabetes with no complications  Ankle pain left. ? ?Diabetic foot exam was performed.  There is no evidence of  neurologic pathology. Patient has absent pulses both feet and needs vascular exam.  She says she will contact her vascular doctor.  She will make an appointment for with Dr.  Posey Pronto for left ankle exam.   RTC  prn ? ? ?Gardiner Barefoot DPM   ?

## 2022-04-16 MED ORDER — GABAPENTIN 300 MG PO CAPS: 300.0000 mg | ORAL_CAPSULE | Freq: Every morning | ORAL | 0 refills | Status: AC

## 2022-04-28 NOTE — Progress Notes (Signed)
?  ?Cardiology Office Note ? ? ?Date:  04/29/2022  ? ?ID:  [name] Washington, DOB [birth year], MRN [id number] ? ?PCP:  Ailene Ards, NP  ? ? ?No chief complaint on file. ? ?Abnormal ECG ? ?Wt Readings from Last 3 Encounters:  ?04/29/22 222 lb 9.6 oz (101 kg)  ?04/11/22 224 lb (101.6 kg)  ?01/10/22 223 lb (101.2 kg)  ?  ? ?  ?History of Present Illness: ?[name] Washington is a 61 y.o. female  with CAD. ? ?Coronary CTA in 11/2021 showed severe mid LAD disease.  ?Cath in 11/2021 showed: ?"Mid Cx lesion is 25% stenosed. ?  Mid LAD lesion is 50% stenosed. ?  Mid RCA lesion is 40% stenosed. ?  2nd Diag lesion is 50% stenosed. ?  Prox RCA lesion is 80% stenosed. ?  A drug-eluting stent was successfully placed using a STENT ONYX FRONTIER [id number], and optimized with IVUS. ?  Post intervention, there is a 0% residual stenosis. ?  RPAV lesion is 70% stenosed. ?  RPDA lesion is 50% stenosed. ?  The left ventricular systolic function is normal. ?  LV end diastolic pressure is normal. ?  The left ventricular ejection fraction is 55-65% by visual estimate. ?  There is no aortic valve stenosis. ?  ?Diffuse, calcific atherosclerotic disease.  This is most notable throughout the LAD.  In the midportion, there is a focal, eccentric 50% area of stenosis after a long, diffuse area of calcification.   ?  ?The distal RCA branches also have a moderate amount of plaque diffusely, consistent with her diabetes.  The circumflex is her largest vessel and is widely patent. ?  ?Continue dual antiplatelet therapy.  Plan for clopidogrel monotherapy after her 6 to 12 months of dual antiplatelet therapy.  She will need aggressive medical therapy including high intensity statin and potentially other lipid-lowering options depending on how she responds.  She will need healthy diet, regular exercise and aggressive diabetes control. ?  ?She has a difficult home situation due to caring for both a son with special needs and her husband who has health issues.  We  will try to see if there are options for assistance for her." ? ?Was doing  well at f/u in Jan 2023.  No angina at that time. ? ?Rare stinging feeling in the chest.  Has occurred when she lifts a lot.  She does have to lift her son.  Tylenol help relieves this.  ? ?Denies : exertional Chest pain. Dizziness. Leg edema. Nitroglycerin use. Orthopnea. Palpitations. Paroxysmal nocturnal dyspnea. Shortness of breath. Syncope.   ? ? ? ?Past Medical History:  ?Diagnosis Date  ? CHEST PAIN, ACUTE   ? Depression   ? DIAB W/NEURO MANIFESTS TYPE II/UNS NOT UNCNTRL   ? DIABETES MELLITUS   ? Esophageal reflux   ? HYPERLIPIDEMIA, MILD   ? HYPERTENSION   ? Osteoarthritis   ? ? ?Past Surgical History:  ?Procedure Laterality Date  ? ABDOMINAL HYSTERECTOMY    ? partial  ? CHOLECYSTECTOMY    ? COLONOSCOPY WITH PROPOFOL N/A 08/15/2014  ? Procedure: COLONOSCOPY WITH PROPOFOL;  Surgeon: Garlan Fair, MD;  Location: [facility];  Service: Endoscopy;  Laterality: N/A;  ? CORONARY STENT INTERVENTION N/A 12/11/2021  ? Procedure: CORONARY STENT INTERVENTION;  Surgeon: Jettie Booze, MD;  Location: [facility] [place] [facility];  Service: Cardiovascular;  Laterality: N/A;  ? INTRAVASCULAR ULTRASOUND/IVUS N/A 12/11/2021  ? Procedure: Intravascular Ultrasound/IVUS;  Surgeon: Jettie Booze, MD;  Location:  [facility];  Service: Cardiovascular;  Laterality: N/A;  ? LEFT HEART CATH AND CORONARY ANGIOGRAPHY N/A 12/11/2021  ? Procedure: LEFT HEART CATH AND CORONARY ANGIOGRAPHY;  Surgeon: Jettie Booze, MD;  Location: [place] [facility];  Service: Cardiovascular;  Laterality: N/A;  ? SALPINGOOPHORECTOMY Right   ? ? ? ?Current Outpatient Medications  ?Medication Sig Dispense Refill  ? aspirin EC 81 MG tablet Take 81 mg by mouth daily.    ? atorvastatin (LIPITOR) 80 MG tablet Take 1 tablet (80 mg total) by mouth daily. 90 tablet 3  ? cetirizine (ZYRTEC) 10 MG tablet Take 10 mg by mouth daily.    ? clopidogrel (PLAVIX) 75 MG tablet  Take 1 tablet (75 mg total) by mouth daily with breakfast. 90 tablet 3  ? cyanocobalamin 50 MCG tablet Take 1 tablet by mouth daily.    ? furosemide (LASIX) 20 MG tablet Take 1 tablet (20 mg total) by mouth daily. 90 tablet 0  ? gabapentin (NEURONTIN) 300 MG capsule Take 1 capsule (300 mg total) by mouth every morning. 90 capsule 0  ? glimepiride (AMARYL) 4 MG tablet Take 1 tablet (4 mg total) by mouth daily with breakfast. 90 tablet 0  ? isosorbide mononitrate (IMDUR) 30 MG 24 hr tablet Take 1 tablet (30 mg total) by mouth daily. 90 tablet 3  ? lisinopril (ZESTRIL) 20 MG tablet Take 20 mg by mouth daily.    ? lisinopril-hydrochlorothiazide (ZESTORETIC) 20-12.5 MG tablet Take 1 tablet by mouth daily. 90 tablet 0  ? metFORMIN (GLUCOPHAGE) 500 MG tablet Take 2 tablets (1,000 mg total) by mouth 2 (two) times daily with a meal. 360 tablet 0  ? metoprolol succinate (TOPROL-XL) 25 MG 24 hr tablet Take 1 tablet (25 mg total) by mouth daily. 90 tablet 0  ? Multiple Vitamins-Minerals (MULTIVITAMIN WITH MINERALS) tablet Take 1 tablet by mouth daily.    ? nitroGLYCERIN (NITROSTAT) 0.4 MG SL tablet Place 1 tablet (0.4 mg total) under the tongue every 5 (five) minutes as needed for chest pain. 25 tablet 6  ? vitamin B-12 (CYANOCOBALAMIN) 50 MCG tablet Take 50 mcg by mouth daily.    ? isosorbide mononitrate (IMDUR) 30 MG 24 hr tablet Take 1 tablet by mouth daily. (Patient not taking: Reported on 04/29/2022)    ? ?No current facility-administered medications for this visit.  ? ? ?Allergies:   Penicillins  ? ? ?Social History:  The patient  reports that she has never smoked. She has never used smokeless tobacco. She reports current alcohol use. She reports that she does not use drugs.  ? ?Family History:  The patient's family history includes Cancer in her paternal aunt; Heart failure in her father; Hypertension in her father and mother; Prostate cancer in her father.  ? ? ?ROS:  Please see the history of present illness.    Otherwise, review of systems are positive for fatigue when she does not sleep well- better with more sleep.   All other systems are reviewed and negative.  ? ? ?PHYSICAL EXAM: ?VS:  BP 112/90   Pulse 84   Ht '4\' 11"'$  (1.499 m)   Wt 222 lb 9.6 oz (101 kg)   SpO2 97%   BMI 44.96 kg/m?  , BMI Body mass index is 44.96 kg/m?. ?GEN: Well nourished, well developed, in no acute distress ?HEENT: normal ?Neck: no JVD, carotid bruits, or masses ?Cardiac: RRR; no murmurs, rubs, or gallops,; left ankle edema - chronic; compression stockings in place ?Respiratory:  clear to auscultation bilaterally, normal work of breathing ?GI: soft, nontender, nondistended, + BS ?MS: no deformity or atrophy ?Skin: warm and dry, no rash ?Neuro:  Strength and sensation are intact ?Psych: euthymic mood, full affect ? ? ? ?Recent Labs: ?09/21/2021: TSH 2.14 ?11/26/2021: Hemoglobin 11.5; Platelets 339 ?04/11/2022: ALT 24; BUN 10; Creatinine, Ser 0.67; Potassium 4.0; Sodium 138  ? ?Lipid Panel ?   ?Component Value Date/Time  ? CHOL 136 02/08/2022 1033  ? TRIG 108 02/08/2022 1033  ? HDL 47 02/08/2022 1033  ? CHOLHDL 2.9 02/08/2022 1033  ? CHOLHDL 3 09/21/2021 1105  ? VLDL 25.6 09/21/2021 1105  ? [place] 69 02/08/2022 1033  ? ?  ?Other studies Reviewed: ?Additional studies/ records that were reviewed today with results demonstrating: labs reviewed. ? ? ?ASSESSMENT AND PLAN: ? ?CAD: s/p RCA stent.  Moderate LAD disease noted at cath. No clear angina.Continue aggressive secondary prevention.  No bleeding problems.  Continue clopidogrel and aspirin. ?HTN: The current medical regimen is effective;  continue present plan and medications.  Home readings are controlled.  < 120/80.  Increase walking.  This will help with blood pressure and diabetes management.   ?Hyperlipidemia: LDL 69. Continue atorvastatin.  ?DM2: A1C 7.9. Increase exercise.  Whole food, plant-based diet.  High-fiber diet.  Avoid processed foods. ?Caregiver burden: Has tried to get respite  care. ?OSA: Using CPAP. ?Leg pain: We will check lower extremity arterial Doppler.  Primary care doctor noted some decrease in the pulses on her left foot. ? ? ?Current medicines are reviewed at length with the patie

## 2022-04-29 ENCOUNTER — Ambulatory Visit (INDEPENDENT_AMBULATORY_CARE_PROVIDER_SITE_OTHER): Payer: Commercial Managed Care - HMO | Admitting: Interventional Cardiology

## 2022-04-29 ENCOUNTER — Encounter: Payer: Self-pay | Admitting: Interventional Cardiology

## 2022-04-29 VITALS — BP 112/90 | HR 84 | Ht 59.0 in | Wt 222.6 lb

## 2022-04-29 DIAGNOSIS — I10 Essential (primary) hypertension: Secondary | ICD-10-CM

## 2022-04-29 DIAGNOSIS — M79606 Pain in leg, unspecified: Secondary | ICD-10-CM

## 2022-04-29 DIAGNOSIS — E782 Mixed hyperlipidemia: Secondary | ICD-10-CM

## 2022-04-29 DIAGNOSIS — Z955 Presence of coronary angioplasty implant and graft: Secondary | ICD-10-CM

## 2022-04-29 DIAGNOSIS — I25118 Atherosclerotic heart disease of native coronary artery with other forms of angina pectoris: Secondary | ICD-10-CM | POA: Diagnosis not present

## 2022-04-29 DIAGNOSIS — E785 Hyperlipidemia, unspecified: Secondary | ICD-10-CM | POA: Diagnosis not present

## 2022-04-29 DIAGNOSIS — E119 Type 2 diabetes mellitus without complications: Secondary | ICD-10-CM

## 2022-04-29 NOTE — Patient Instructions (Signed)
Medication Instructions:  ?Your physician recommends that you continue on your current medications as directed. Please refer to the Current Medication list given to you today. ? ?*If you need a refill on your cardiac medications before your next appointment, please call your pharmacy* ? ? ?Lab Work: ?none ?If you have labs (blood work) drawn today and your tests are completely normal, you will receive your results only by: ?MyChart Message (if you have MyChart) OR ?A paper copy in the mail ?If you have any lab test that is abnormal or we need to change your treatment, we will call you to review the results. ? ? ?Testing/Procedures: ?Your physician has requested that you have a lower extremity arterial duplex. This test is an ultrasound of the arteries in the legs.  It looks at arterial blood flow in the legs.a Allow one hour for Lower  Arterial scans. There are no restrictions or special instructions ? ? ? ?Follow-Up: ?At [facility], you and your health needs are our priority.  As part of our continuing mission to provide you with exceptional heart care, we have created designated Provider Care Teams.  These Care Teams include your primary Cardiologist (physician) and Advanced Practice Providers (APPs -  Physician Assistants and Nurse Practitioners) who all work together to provide you with the care you need, when you need it. ? ?We recommend signing up for the patient portal called "MyChart".  Sign up information is provided on this After Visit Summary.  MyChart is used to connect with patients for Virtual Visits (Telemedicine).  Patients are able to view lab/test results, encounter notes, upcoming appointments, etc.  Non-urgent messages can be sent to your provider as well.   ?To learn more about what you can do with MyChart, go to [email or web address].   ? ?Your next appointment:   ?8 month(s)--January 2024 ? ?The format for your next appointment:   ?In Person ? ?Provider:   ?Larae Grooms, MD    ? ? ?Other Instructions ?  ? ?Important Information About Sugar ? ? ? ? ? ? ?

## 2022-05-09 ENCOUNTER — Ambulatory Visit (INDEPENDENT_AMBULATORY_CARE_PROVIDER_SITE_OTHER): Payer: Commercial Managed Care - HMO | Admitting: Nurse Practitioner

## 2022-05-09 ENCOUNTER — Encounter: Payer: Self-pay | Admitting: Nurse Practitioner

## 2022-05-09 VITALS — BP 124/80 | HR 80 | Ht 59.0 in | Wt 224.0 lb

## 2022-05-09 DIAGNOSIS — E1165 Type 2 diabetes mellitus with hyperglycemia: Secondary | ICD-10-CM

## 2022-05-09 NOTE — Progress Notes (Signed)
 Established Patient Office Visit  Subjective   Patient ID: [name] Washington, female    DOB: 07/22/1961  Age: 60 y.o. MRN: [id number]  Chief Complaint  Patient presents with   Follow-up   Diabetes    Patient arrives today for the above.  Last office visit A1c was ordered and came back at 7.9.  This is an increase from previous A1c which was 7.8.  I had my medical assistant call the patient and request that she come in sooner than scheduled to discuss diabetes treatment.  So she presents today for this.  She continues on glimepiride 4 mg/day, metformin 1000 mg twice a day, she is also on ACE inhibitor and statin therapy.  Albuminuria checked last office visit which was negative.    She has a history of CAD, and had coronary artery stent placed in February 2022.  She reports taking her medications as prescribed.  She tells me she used to walk approximately 5 miles a day, at that time her blood sugars are much better controlled.  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    DIABETES MELLITUS    Esophageal reflux    HYPERLIPIDEMIA, MILD    HYPERTENSION    Osteoarthritis       Review of Systems  Respiratory:  Negative for shortness of breath.   Cardiovascular:  Negative for chest pain.  Neurological:  Negative for dizziness.  Psychiatric/Behavioral:  Negative for depression.      Objective:     BP 124/80 (BP Location: Left Arm, Patient Position: Sitting, Cuff Size: Normal)   Pulse 80   Ht 4' 11" (1.499 m)   Wt 224 lb (101.6 kg)   SpO2 96%   BMI 45.24 kg/m  BP Readings from Last 3 Encounters:  05/09/22 124/80  04/29/22 112/90  04/11/22 120/70   Wt Readings from Last 3 Encounters:  05/09/22 224 lb (101.6 kg)  04/29/22 222 lb 9.6 oz (101 kg)  04/11/22 224 lb (101.6 kg)      Physical Exam Vitals reviewed.  Constitutional:      General: She is not in acute distress.    Appearance:  Normal appearance.  HENT:     Head: Normocephalic and atraumatic.  Neck:     Vascular: No carotid bruit.  Cardiovascular:     Rate and Rhythm: Normal rate and regular rhythm.     Pulses: Normal pulses.     Heart sounds: Normal heart sounds.  Pulmonary:     Effort: Pulmonary effort is normal.     Breath sounds: Normal breath sounds.  Skin:    General: Skin is warm and dry.  Neurological:     General: No focal deficit present.     Mental Status: She is alert and oriented to person, place, and time.  Psychiatric:        Mood and Affect: Mood normal.        Behavior: Behavior normal.        Judgment: Judgment normal.     No results found for any visits on 05/09/22.  Last metabolic panel Lab Results  Component Value Date   GLUCOSE 75 04/11/2022   NA 138 04/11/2022   K 4.0 04/11/2022   CL 103 04/11/2022   CO2 28 04/11/2022   BUN 10 04/11/2022   CREATININE 0.67 04/11/2022   EGFR 100 02/08/2022   CALCIUM 9.9 04/11/2022     PROT 7.7 04/11/2022   ALBUMIN 4.3 04/11/2022   LABGLOB 2.6 02/08/2022   AGRATIO 1.6 02/08/2022   BILITOT 0.4 04/11/2022   ALKPHOS 110 04/11/2022   AST 17 04/11/2022   ALT 24 04/11/2022   Last lipids Lab Results  Component Value Date   CHOL 136 02/08/2022   HDL 47 02/08/2022   LDLCALC 69 02/08/2022   TRIG 108 02/08/2022   CHOLHDL 2.9 02/08/2022   Last hemoglobin A1c Lab Results  Component Value Date   HGBA1C 7.9 (H) 04/11/2022      The 10-year ASCVD risk score (Arnett DK, et al., 2019) is: 11.1%    Assessment & Plan:   Problem List Items Addressed This Visit       Endocrine   Type II diabetes mellitus ([place]) - Primary    Return in about 3 months (around 08/09/2022) for With Ryelan Kazee.    Ailene Ards, NP

## 2022-05-09 NOTE — Assessment & Plan Note (Signed)
Chronic, suboptimally controlled.  Last A1c 7.9.  I recommended she try Wilder Glade, but after discussing possible adverse effects patient has elected to focus on lifestyle modification.  Patient encouraged to increase physical activity as tolerated and to walk more during the day.  She is also encouraged to continue focusing on improving diet.  Has been referred to dietitian.  In the meantime she will continue glimepiride 4 mg mouth daily, metformin 1000 mg twice a day, ACE inhibitor, and statin.

## 2022-05-17 ENCOUNTER — Ambulatory Visit: Payer: Managed Care, Other (non HMO) | Admitting: Podiatry

## 2022-05-17 ENCOUNTER — Encounter (HOSPITAL_COMMUNITY): Payer: Commercial Managed Care - HMO

## 2022-05-21 ENCOUNTER — Other Ambulatory Visit: Payer: Self-pay | Admitting: Interventional Cardiology

## 2022-05-21 DIAGNOSIS — I739 Peripheral vascular disease, unspecified: Secondary | ICD-10-CM

## 2022-05-27 ENCOUNTER — Other Ambulatory Visit: Payer: Self-pay | Admitting: Nurse Practitioner

## 2022-05-27 DIAGNOSIS — I10 Essential (primary) hypertension: Secondary | ICD-10-CM

## 2022-05-27 DIAGNOSIS — E1140 Type 2 diabetes mellitus with diabetic neuropathy, unspecified: Secondary | ICD-10-CM

## 2022-05-28 ENCOUNTER — Ambulatory Visit (HOSPITAL_COMMUNITY)
Admission: RE | Admit: 2022-05-28 | Discharge: 2022-05-28 | Disposition: A | Discharge status: Home / Self Care | Payer: Commercial Managed Care - HMO | Source: Ambulatory Visit | Attending: Interventional Cardiology | Admitting: Interventional Cardiology

## 2022-05-28 DIAGNOSIS — I739 Peripheral vascular disease, unspecified: Secondary | ICD-10-CM | POA: Diagnosis not present

## 2022-06-03 ENCOUNTER — Telehealth: Payer: Self-pay | Admitting: Nurse Practitioner

## 2022-06-03 DIAGNOSIS — E1140 Type 2 diabetes mellitus with diabetic neuropathy, unspecified: Secondary | ICD-10-CM

## 2022-06-03 DIAGNOSIS — I10 Essential (primary) hypertension: Secondary | ICD-10-CM

## 2022-06-03 NOTE — Telephone Encounter (Signed)
Caller & Relationship to patient: self  Call back number: [phone]  Date of last office visit: 5.25.2023   Date of next office visit: 8.25.2023   Medication(s) to be refilled:  glimepiride (AMARYL) 4 MG tablet furosemide (LASIX) 20 MG tablet metFORMIN (GLUCOPHAGE) 500 MG tablet metoprolol succinate (TOPROL-XL) 25 MG 24 hr tablet   Preferred Pharmacy:  [facility], [place] Phone:  [phone]  Fax:  [phone]

## 2022-06-05 ENCOUNTER — Other Ambulatory Visit: Payer: Self-pay

## 2022-06-05 DIAGNOSIS — E1140 Type 2 diabetes mellitus with diabetic neuropathy, unspecified: Secondary | ICD-10-CM

## 2022-06-05 DIAGNOSIS — I10 Essential (primary) hypertension: Secondary | ICD-10-CM

## 2022-06-05 MED ORDER — GLIMEPIRIDE 4 MG PO TABS: 4.0000 mg | ORAL_TABLET | Freq: Every day | ORAL | 0 refills | Status: DC

## 2022-06-05 MED ORDER — METFORMIN HCL 500 MG PO TABS: 1000.0000 mg | ORAL_TABLET | Freq: Two times a day (BID) | ORAL | 0 refills | Status: DC

## 2022-06-05 MED ORDER — FUROSEMIDE 20 MG PO TABS: 20.0000 mg | ORAL_TABLET | Freq: Every day | ORAL | 0 refills | Status: DC

## 2022-06-05 MED ORDER — METOPROLOL SUCCINATE ER 25 MG PO TB24: 25.0000 mg | ORAL_TABLET | Freq: Every day | ORAL | 1 refills | Status: AC

## 2022-06-05 MED ORDER — FUROSEMIDE 20 MG PO TABS: 20.0000 mg | ORAL_TABLET | Freq: Every day | ORAL | 1 refills | Status: DC

## 2022-06-05 MED ORDER — GLIMEPIRIDE 4 MG PO TABS: 4.0000 mg | ORAL_TABLET | Freq: Every day | ORAL | 1 refills | Status: AC

## 2022-06-05 MED ORDER — METOPROLOL SUCCINATE ER 25 MG PO TB24: 25.0000 mg | ORAL_TABLET | Freq: Every day | ORAL | 0 refills | Status: DC

## 2022-06-05 MED ORDER — METFORMIN HCL 500 MG PO TABS: 1000.0000 mg | ORAL_TABLET | Freq: Two times a day (BID) | ORAL | 1 refills | Status: AC

## 2022-06-11 ENCOUNTER — Other Ambulatory Visit: Payer: Self-pay | Admitting: Nurse Practitioner

## 2022-06-11 DIAGNOSIS — I10 Essential (primary) hypertension: Secondary | ICD-10-CM

## 2022-06-20 ENCOUNTER — Other Ambulatory Visit: Payer: Self-pay | Admitting: Nurse Practitioner

## 2022-06-20 DIAGNOSIS — I10 Essential (primary) hypertension: Secondary | ICD-10-CM

## 2022-06-20 MED ORDER — LISINOPRIL-HYDROCHLOROTHIAZIDE 20-12.5 MG PO TABS: 1.0000 | ORAL_TABLET | Freq: Every day | ORAL | 2 refills | Status: AC

## 2022-06-26 ENCOUNTER — Ambulatory Visit: Payer: Managed Care, Other (non HMO) | Admitting: Registered"

## 2022-07-11 ENCOUNTER — Ambulatory Visit: Payer: Managed Care, Other (non HMO) | Admitting: Nurse Practitioner

## 2022-08-09 ENCOUNTER — Ambulatory Visit: Payer: Commercial Managed Care - HMO | Admitting: Nurse Practitioner

## 2022-10-08 ENCOUNTER — Other Ambulatory Visit: Payer: Self-pay | Admitting: Nurse Practitioner

## 2022-10-08 DIAGNOSIS — E1140 Type 2 diabetes mellitus with diabetic neuropathy, unspecified: Secondary | ICD-10-CM

## 2022-10-20 ENCOUNTER — Other Ambulatory Visit: Payer: Self-pay | Admitting: Interventional Cardiology

## 2023-01-10 ENCOUNTER — Telehealth: Payer: Self-pay | Admitting: Interventional Cardiology

## 2023-01-10 NOTE — Telephone Encounter (Signed)
Pt is calling to ask Dr. Hassell Done nurse Fraser Din if she received the [facility] forms she dropped off to our office on 1/24 to have Dr. Irish Lack fill out.  I do not see any documentation where these forms were dropped off and noted in epic for our office.   Did inform her that Dr. Irish Lack and Fraser Din are out of the office today, but I will forward this message to them both to address with the pt, upon return to the office.   Pt verbalized understanding and agrees with this plan.

## 2023-01-10 NOTE — Telephone Encounter (Signed)
Follow Up:     Patient said she dropped off paperwork on 12-2422 to be completed. She wants to know if it is ready ?

## 2023-01-15 NOTE — Telephone Encounter (Signed)
Completed paperwork taken to front desk

## 2023-01-16 ENCOUNTER — Other Ambulatory Visit: Payer: Self-pay | Admitting: Interventional Cardiology

## 2023-01-16 NOTE — Telephone Encounter (Signed)
Completed DMV form scanned to patient's chart.  Patient notified to pick up original form.

## 2023-02-17 ENCOUNTER — Telehealth: Payer: Self-pay | Admitting: Nurse Practitioner

## 2023-02-17 NOTE — Telephone Encounter (Signed)
Called pt to clarify what she needed, she wanted an order put, let pt know she need to bee send before it can be done. Pt stated she will do that when she ready

## 2023-02-17 NOTE — Telephone Encounter (Signed)
Pt called wanted to if there has been order in for her for a rolling walker.  Please give a called back with update

## 2023-02-18 ENCOUNTER — Other Ambulatory Visit: Payer: Self-pay

## 2023-02-18 MED ORDER — CLOPIDOGREL BISULFATE 75 MG PO TABS: 75.0000 mg | ORAL_TABLET | Freq: Every day | ORAL | 0 refills | Status: DC

## 2023-02-18 MED ORDER — ATORVASTATIN CALCIUM 80 MG PO TABS: 80.0000 mg | ORAL_TABLET | Freq: Every day | ORAL | 0 refills | Status: DC

## 2023-02-18 NOTE — Addendum Note (Signed)
Addended by: Carter Kitten D on: 02/18/2023 02:01 PM   Modules accepted: Orders

## 2023-02-21 ENCOUNTER — Other Ambulatory Visit: Payer: Self-pay | Admitting: Nurse Practitioner

## 2023-02-21 DIAGNOSIS — I10 Essential (primary) hypertension: Secondary | ICD-10-CM

## 2023-03-30 NOTE — Progress Notes (Unsigned)
Cardiology Office Note   Date:  03/31/2023   ID:  [name] Washington, DOB 09-08-1961, MRN [id number]  PCP:  Aliene Beams, MD    No chief complaint on file.  CAD  Wt Readings from Last 3 Encounters:  03/31/23 206 lb 6.4 oz (93.6 kg)  05/09/22 224 lb (101.6 kg)  04/29/22 222 lb 9.6 oz (101 kg)       History of Present Illness: [name] Washington is a 62 y.o. female  with CAD.   Coronary CTA in 11/2021 showed severe mid LAD disease.  Cath in 11/2021 showed: "Mid Cx lesion is 25% stenosed.   Mid LAD lesion is 50% stenosed.   Mid RCA lesion is 40% stenosed.   2nd Diag lesion is 50% stenosed.   Prox RCA lesion is 80% stenosed.   A drug-eluting stent was successfully placed using a STENT ONYX FRONTIER 4.0X18, and optimized with IVUS.   Post intervention, there is a 0% residual stenosis.   RPAV lesion is 70% stenosed.   RPDA lesion is 50% stenosed.   The left ventricular systolic function is normal.   LV end diastolic pressure is normal.   The left ventricular ejection fraction is 55-65% by visual estimate.   There is no aortic valve stenosis.   Diffuse, calcific atherosclerotic disease.  This is most notable throughout the LAD.  In the midportion, there is a focal, eccentric 50% area of stenosis after a long, diffuse area of calcification.     The distal RCA branches also have a moderate amount of plaque diffusely, consistent with her diabetes.  The circumflex is her largest vessel and is widely patent.   Continue dual antiplatelet therapy.  Plan for clopidogrel monotherapy after her 6 to 12 months of dual antiplatelet therapy.  She will need aggressive medical therapy including high intensity statin and potentially other lipid-lowering options depending on how she responds.  She will need healthy diet, regular exercise and aggressive diabetes control.   She has a difficult home situation due to caring for both a son with special needs and her husband who has health issues.  We  will try to see if there are options for assistance for her."   Was doing  well at f/u in Jan 2023.  No angina at that time.   Rare stinging feeling in the chest.  Has occurred when she lifts a lot.  She does have to lift her son.  Tylenol help relieves this.   Still has difficulty finding caretaker respites.   Has had some dizziness and low BP readings, systolics in the 90s. SHe was drinking more coffee of late.  Felt she was losing more fluid.  Using CPAP at night.  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    DIABETES MELLITUS    Esophageal reflux    HYPERLIPIDEMIA, MILD    HYPERTENSION    Osteoarthritis     Past Surgical History:  Procedure Laterality Date   ABDOMINAL HYSTERECTOMY     partial   CHOLECYSTECTOMY     COLONOSCOPY WITH PROPOFOL N/A 08/15/2014   Procedure: COLONOSCOPY WITH PROPOFOL;  Surgeon: Charolett Bumpers, MD;  Location: [facility]COPY;  Service: Endoscopy;  Laterality: N/A;   CORONARY STENT INTERVENTION N/A 12/11/2021   Procedure: CORONARY STENT INTERVENTION;  Surgeon: Corky Crafts, MD;  Location: [facility] INVASIVE CV LAB;  Service: Cardiovascular;  Laterality: N/A;  CORONARY ULTRASOUND/IVUS N/A 12/11/2021   Procedure: Intravascular Ultrasound/IVUS;  Surgeon: Corky Crafts, MD;  Location: [facility] INVASIVE CV LAB;  Service: Cardiovascular;  Laterality: N/A;   LEFT HEART CATH AND CORONARY ANGIOGRAPHY N/A 12/11/2021   Procedure: LEFT HEART CATH AND CORONARY ANGIOGRAPHY;  Surgeon: Corky Crafts, MD;  Location: [facility] INVASIVE CV LAB;  Service: Cardiovascular;  Laterality: N/A;   SALPINGOOPHORECTOMY Right      Current Outpatient Medications  Medication Sig Dispense Refill   aspirin EC 81 MG tablet Take 81 mg by mouth daily.     atorvastatin (LIPITOR) 80 MG tablet Take 1 tablet (80 mg total) by mouth daily. 90 tablet 0   cetirizine (ZYRTEC) 10 MG tablet Take 10 mg by mouth  daily.     clopidogrel (PLAVIX) 75 MG tablet Take 1 tablet (75 mg total) by mouth daily with breakfast. 90 tablet 0   cyanocobalamin 50 MCG tablet Take 1 tablet by mouth daily.     FARXIGA 10 MG TABS tablet Take 10 mg by mouth daily.     furosemide (LASIX) 20 MG tablet Take 1 tablet (20 mg total) by mouth daily. 90 tablet 1   gabapentin (NEURONTIN) 300 MG capsule Take 1 capsule (300 mg total) by mouth every morning. 90 capsule 0   glimepiride (AMARYL) 4 MG tablet Take 1 tablet (4 mg total) by mouth daily with breakfast. 90 tablet 1   glipiZIDE (GLUCOTROL XL) 10 MG 24 hr tablet Take 10 mg by mouth daily.     isosorbide mononitrate (IMDUR) 30 MG 24 hr tablet Take 1 tablet by mouth once daily 90 tablet 3   lisinopril-hydrochlorothiazide (ZESTORETIC) 20-12.5 MG tablet Take 1 tablet by mouth daily. 90 tablet 2   metFORMIN (GLUCOPHAGE) 500 MG tablet Take 2 tablets (1,000 mg total) by mouth 2 (two) times daily with a meal. 360 tablet 1   metoprolol succinate (TOPROL-XL) 25 MG 24 hr tablet Take 1 tablet (25 mg total) by mouth daily. 90 tablet 1   Multiple Vitamins-Minerals (MULTIVITAMIN WITH MINERALS) tablet Take 1 tablet by mouth daily.     nitroGLYCERIN (NITROSTAT) 0.4 MG SL tablet Place 1 tablet (0.4 mg total) under the tongue every 5 (five) minutes as needed for chest pain. 25 tablet 6   vitamin B-12 (CYANOCOBALAMIN) 50 MCG tablet Take 50 mcg by mouth daily.     No current facility-administered medications for this visit.    Allergies:   Penicillins    Social History:  The patient  reports that she has never smoked. She has never used smokeless tobacco. She reports current alcohol use. She reports that she does not use drugs.   Family History:  The patient's family history includes Cancer in her paternal aunt; Heart failure in her father; Hypertension in her father and mother; Prostate cancer in her father.    ROS:  Please see the history of present illness.   Otherwise, review of systems are  positive for intentional weight loss.   All other systems are reviewed and negative.    PHYSICAL EXAM: VS:  BP 112/68   Pulse 68   Ht  (1.499 m)   Wt 206 lb 6.4 oz (93.6 kg)   SpO2 99%   BMI 41.69 kg/m  , BMI Body mass index is 41.69 kg/m. GEN: Well nourished, well developed, in no acute distress HEENT: normal Neck: no JVD, carotid bruits, or masses Cardiac: RRR; no murmurs, rubs, or gallops,no edema  Respiratory:  clear to auscultation bilaterally, normal work  of breathing GI: soft, nontender, nondistended, + BS MS: no deformity or atrophy Skin: warm and dry, no rash Neuro:  Strength and sensation are intact Psych: euthymic mood, full affect   EKG:   The ekg ordered today demonstrates NSR, nonspecific inferior ST changes, similar to prior   Recent Labs: 04/11/2022: ALT 24; BUN 10; Creatinine, Ser 0.67; Potassium 4.0; Sodium 138   Lipid Panel    Component Value Date/Time   CHOL 136 02/08/2022 1033   TRIG 108 02/08/2022 1033   HDL 47 02/08/2022 1033   CHOLHDL 2.9 02/08/2022 1033   CHOLHDL 3 09/21/2021 1105   VLDL 25.6 09/21/2021 1105   LDLCALC 69 02/08/2022 1033     Other studies Reviewed: Additional studies/ records that were reviewed today with results demonstrating: labs reviewed.   ASSESSMENT AND PLAN:  CAD: Status post RCA stent.  Moderate LAD disease noted at cath in 2022.  No angina.   Hypertension: The current medical regimen is effective;  continue present plan and medications.  Decrease furosemide to 20 mg daily as needed.  Had some dizziness which may have been related to dehydration.  She was drinking more coffee.  Try to stay better hydrated with water. Hyperlipidemia: LDL 60 in 6/23. Diabetes type 2: Whole food, plant-based diet.  High-fiber diet.  Avoid processed foods.  Exercise target noted below. A1C 7.4. Caregiver burden: As best she can, she should try to get respite care. OSA: Using CPAP. Leg pain: Imaging in June 2023 showed normal  arterial flow to both feet.   Current medicines are reviewed at length with the patient today.  The patient concerns regarding her medicines were addressed.  The following changes have been made:  furosemide decreased to prn  Labs/ tests ordered today include:  No orders of the defined types were placed in this encounter.   Recommend 150 minutes/week of aerobic exercise Low fat, low carb, high fiber diet recommended  Disposition:   FU in 1 year   Signed, Lance Muss, MD  03/31/2023 11:44 AM    [facility] [address] [place], [place], [place]  [address] Phone: [phone]; Fax: [phone]

## 2023-03-31 ENCOUNTER — Ambulatory Visit: Payer: Commercial Managed Care - HMO | Attending: Interventional Cardiology | Admitting: Interventional Cardiology

## 2023-03-31 VITALS — BP 112/68 | HR 68 | Ht 59.0 in | Wt 206.4 lb

## 2023-03-31 DIAGNOSIS — I25118 Atherosclerotic heart disease of native coronary artery with other forms of angina pectoris: Secondary | ICD-10-CM | POA: Diagnosis not present

## 2023-03-31 DIAGNOSIS — E782 Mixed hyperlipidemia: Secondary | ICD-10-CM

## 2023-03-31 DIAGNOSIS — Z955 Presence of coronary angioplasty implant and graft: Secondary | ICD-10-CM | POA: Diagnosis not present

## 2023-03-31 DIAGNOSIS — I10 Essential (primary) hypertension: Secondary | ICD-10-CM

## 2023-03-31 DIAGNOSIS — E785 Hyperlipidemia, unspecified: Secondary | ICD-10-CM | POA: Diagnosis not present

## 2023-03-31 DIAGNOSIS — E119 Type 2 diabetes mellitus without complications: Secondary | ICD-10-CM

## 2023-03-31 MED ORDER — FUROSEMIDE 20 MG PO TABS: 20.0000 mg | ORAL_TABLET | Freq: Every day | ORAL | 3 refills | Status: DC | PRN

## 2023-03-31 NOTE — Patient Instructions (Signed)
Medication Instructions:  Your physician has recommended you make the following change in your medication: Change furosemide to daily as needed  *If you need a refill on your cardiac medications before your next appointment, please call your pharmacy*   Lab Work: none If you have labs (blood work) drawn today and your tests are completely normal, you will receive your results only by: MyChart Message (if you have MyChart) OR A paper copy in the mail If you have any lab test that is abnormal or we need to change your treatment, we will call you to review the results.   Testing/Procedures: none   Follow-Up: At [facility], you and your health needs are our priority.  As part of our continuing mission to provide you with exceptional heart care, we have created designated Provider Care Teams.  These Care Teams include your primary Cardiologist (physician) and Advanced Practice Providers (APPs -  Physician Assistants and Nurse Practitioners) who all work together to provide you with the care you need, when you need it.  We recommend signing up for the patient portal called "MyChart".  Sign up information is provided on this After Visit Summary.  MyChart is used to connect with patients for Virtual Visits (Telemedicine).  Patients are able to view lab/test results, encounter notes, upcoming appointments, etc.  Non-urgent messages can be sent to your provider as well.   To learn more about what you can do with MyChart, go to [email or web address].    Your next appointment:   12 month(s)  Provider:   Lance Muss, MD     Other Instructions  Try to increase water intake

## 2023-05-15 ENCOUNTER — Other Ambulatory Visit: Payer: Self-pay | Admitting: Interventional Cardiology

## 2023-06-25 ENCOUNTER — Ambulatory Visit: Payer: Commercial Managed Care - HMO | Admitting: Podiatry

## 2023-07-24 ENCOUNTER — Ambulatory Visit: Payer: Commercial Managed Care - HMO | Admitting: Podiatry

## 2023-07-28 ENCOUNTER — Ambulatory Visit (INDEPENDENT_AMBULATORY_CARE_PROVIDER_SITE_OTHER): Payer: Commercial Managed Care - HMO | Admitting: Podiatry

## 2023-07-28 DIAGNOSIS — E1142 Type 2 diabetes mellitus with diabetic polyneuropathy: Secondary | ICD-10-CM

## 2023-07-28 DIAGNOSIS — M778 Other enthesopathies, not elsewhere classified: Secondary | ICD-10-CM | POA: Insufficient documentation

## 2023-07-28 NOTE — Progress Notes (Signed)
This patient presents to the office for diabetic foot exam.  Patient was referred to the office by  her doctor. She has a painful area on the outside of her right forefoot.  No history of infection or drainage.  This patient presents to the office for foot exam due to having a history of diabetes.  Vascular  Dorsalis pedis and posterior tibial pulses are weakly palpable due to swelling. B/L.  Capillary return  WNL.  Temperature gradient is  WNL.  Skin turgor  WNL  Sensorium  Senn Weinstein monofilament wire  WNL. Normal tactile sensation.  Nail Exam  Patient has normal nails with no evidence of bacterial or fungal infection.  Orthopedic  Exam  Muscle tone and muscle strength  WNL.  No limitations of motion feet  B/L.  No crepitus or joint effusion noted.  Foot type is unremarkable and digits show no abnormalities.  Bony prominences are unremarkable.  Ankle pain left ankle on ROM.    Skin  No open lesions.  Normal skin texture and turgor.   Diabetes with no complications   Capsulitis 5th met right foot.  Diabetic foot exam was performed.  There is no evidence of  neurologic pathology. Padding for sub 5th met right foot. She will make an appointment for with Dr.  Allena Katz for left ankle exam.   RTC  1 year.   Helane Gunther DPM

## 2023-11-20 ENCOUNTER — Ambulatory Visit: Payer: Commercial Managed Care - HMO | Attending: Cardiology | Admitting: Cardiology

## 2023-11-20 ENCOUNTER — Encounter: Payer: Self-pay | Admitting: Cardiology

## 2023-11-20 VITALS — BP 118/66 | HR 71 | Ht 59.0 in | Wt 207.4 lb

## 2023-11-20 DIAGNOSIS — I25118 Atherosclerotic heart disease of native coronary artery with other forms of angina pectoris: Secondary | ICD-10-CM | POA: Diagnosis not present

## 2023-11-20 DIAGNOSIS — Z955 Presence of coronary angioplasty implant and graft: Secondary | ICD-10-CM

## 2023-11-20 DIAGNOSIS — E785 Hyperlipidemia, unspecified: Secondary | ICD-10-CM

## 2023-11-20 NOTE — Patient Instructions (Signed)
Medication Instructions:  Your physician has recommended you make the following change in your medication:  1) STOP aspirin  *If you need a refill on your cardiac medications before your next appointment, please call your pharmacy*  Lab Work: None ordered today.  Testing/Procedures: None ordered today.  Follow-Up: At [facility], you and your health needs are our priority.  As part of our continuing mission to provide you with exceptional heart care, we have created designated Provider Care Teams.  These Care Teams include your primary Cardiologist (physician) and Advanced Practice Providers (APPs -  Physician Assistants and Nurse Practitioners) who all work together to provide you with the care you need, when you need it.  We recommend signing up for the patient portal called "MyChart".  Sign up information is provided on this After Visit Summary.  MyChart is used to connect with patients for Virtual Visits (Telemedicine).  Patients are able to view lab/test results, encounter notes, upcoming appointments, etc.  Non-urgent messages can be sent to your provider as well.   To learn more about what you can do with MyChart, go to [email or web address].    Your next appointment:   1 year(s)  The format for your next appointment:   In Person  Provider:   Donato Schultz, MD

## 2023-11-20 NOTE — Progress Notes (Signed)
Cardiology Office Note:  .   Date:  11/20/2023  ID:  CAMBRI Washington, DOB 1961/09/24, MRN [id number] PCP: Aliene Beams, MD  [facility] Providers Cardiologist:  Donato Schultz, MD     History of Present Illness: .   [name] Washington is a 62 y.o. female Discussed with the use of AI scribe   History of Present Illness   The patient, a 62 year old with a history of coronary artery disease, presented for a follow-up visit. In December 2022, she underwent a coronary CT scan that revealed severe mid LAD disease. Subsequently, she had a cardiac catheterization with a drug-eluting stent placed in the proximal RCA. The LAD showed an eccentric 50% area after a diffuse area of lung calcification. The patient was initially on dual antiplatelet therapy for 6-12 months and is now on aspirin 81mg  only. She also takes Lasix 20mg  as needed, Isosorbide 30mg  daily, Lisinopril-Hydrochlorothiazide 20/12.5mg  daily, and Metoprolol 25mg  daily.  The patient reported feeling tired frequently, attributing this to her role as a caregiver for her husband and son. She also mentioned a recent bereavement, with the loss of her father in June. The patient is trying to manage her health by attempting to lose weight and improve her diet, but noted that she often eats late at night. She reported occasional pain, which she believes is due to lack of rest, and manages this with Tylenol as needed. She has not felt the need to use nitroglycerin for her pain.          Studies Reviewed: .        Results   LABS LDL cholesterol: 91 (05/2023)  RADIOLOGY Coronary CT: Severe mid LAD disease (11/2021)  DIAGNOSTIC Cardiac catheterization: Drug eluting stent placed in proximal RCA; Eccentric 50% stenosis in LAD after diffuse calcification     Risk Assessment/Calculations:            Physical Exam:   VS:  BP 118/66   Pulse 71   Ht 4\' 11"  (1.499 m)   Wt 207 lb 6.4 oz (94.1 kg)   SpO2 99%   BMI 41.89 kg/m    Wt  Readings from Last 3 Encounters:  11/20/23 207 lb 6.4 oz (94.1 kg)  03/31/23 206 lb 6.4 oz (93.6 kg)  05/09/22 224 lb (101.6 kg)    GEN: Well nourished, well developed in no acute distress NECK: No JVD; No carotid bruits CARDIAC: RRR, no murmurs, no rubs, no gallops RESPIRATORY:  Clear to auscultation without rales, wheezing or rhonchi  ABDOMEN: Soft, non-tender, non-distended EXTREMITIES:  No edema; No deformity   ASSESSMENT AND PLAN: .    Assessment and Plan    Coronary Artery Disease (CAD) Severe mid LAD disease treated with drug-eluting stent in December 2022. Currently on aspirin 81 mg daily. Reports fatigue likely due to caregiving responsibilities. Blood pressure and heart sounds are stable. LDL cholesterol was 91 in June, previously in the 60s. Discussed transitioning from dual antiplatelet therapy to monotherapy due to increased bleeding risk without additional benefit. Patient prefers to discontinue aspirin and continue Plavix monotherapy. - Discontinue aspirin 81 mg - Continue Plavix (clopidogrel) monotherapy - Schedule follow-up in one year - On isosorbide 30 mg daily and Lasix 20 mg as needed. No acute symptoms reported. - Continue isosorbide 30 mg daily - Continue Lasix 20 mg as needed  Hypertension Blood pressure is well-controlled with current medications: lisinopril-hydrochlorothiazide 20/12.5 mg daily and metoprolol 25 mg daily. - Continue current antihypertensive regimen  Hyperlipidemia LDL cholesterol  was 91 in June, previously in the 60s. Atorvastatin is effective in managing cholesterol levels. Patient prefers to take atorvastatin in the morning to ensure regular intake. - Continue atorvastatin - Patient may take atorvastatin in the morning if it ensures regular intake  Sleep Apnea Uses CPAP machine regularly. Reports getting about 5.5 hours of sleep when able to rest. - Continue using CPAP machine  General Health Maintenance Patient is trying to lose  weight and cook healthier meals but reports difficulty due to caregiving responsibilities and eating late at night. - Encourage healthy eating habits - Encourage regular rest and self-care  Follow-up - Schedule follow-up in one year.               Signed, Donato Schultz, MD

## 2023-11-20 NOTE — Addendum Note (Signed)
Addended by: Jake Bathe on: 11/20/2023 04:35 PM   Modules accepted: Level of Service

## 2024-01-04 ENCOUNTER — Other Ambulatory Visit: Payer: Self-pay | Admitting: Interventional Cardiology

## 2024-03-05 ENCOUNTER — Telehealth: Payer: Self-pay | Admitting: *Deleted

## 2024-03-05 NOTE — Telephone Encounter (Signed)
    Primary Cardiologist: Donato Schultz, MD  Chart reviewed as part of pre-operative protocol coverage. Simple dental extractions are considered low risk procedures per guidelines and generally do not require any specific cardiac clearance. It is also generally accepted that for simple extractions and dental cleanings, there is no need to interrupt blood thinner therapy.   SBE prophylaxis is not required for the patient.  I will route this recommendation to the requesting party via Epic fax function and remove from pre-op pool.  Please call with questions.  Joylene Grapes, NP 03/05/2024, 2:45 PM

## 2024-03-05 NOTE — Telephone Encounter (Signed)
   Pre-operative Risk Assessment    Patient Name: [name] Washington  DOB: 01-10-1961 MRN: [id number]   Date of last office visit: 11/20/23 DR. SKAINS Date of next office visit: NONE  Request for Surgical Clearance    Procedure:  Dental Extraction - Amount of Teeth to be Pulled:  1 TOOTH FOR SURGICAL EXTRACTION  Date of Surgery:  Clearance 03/23/24                                Surgeon:  DR. Debbora Presto, DDS Surgeon's Group or Practice Name:  [facility]  Phone number:  [phone] Fax number:  [phone]   Type of Clearance Requested:   - Medical  - Pharmacy:  Hold Clopidogrel (Plavix)     Type of Anesthesia:  Local    Additional requests/questions:    [name]   03/05/2024, 2:04 PM

## 2024-05-07 ENCOUNTER — Other Ambulatory Visit: Payer: Self-pay | Admitting: Interventional Cardiology

## 2024-06-11 DIAGNOSIS — I10 Essential (primary) hypertension: Secondary | ICD-10-CM | POA: Diagnosis not present

## 2024-06-11 DIAGNOSIS — Z6841 Body Mass Index (BMI) 40.0 and over, adult: Secondary | ICD-10-CM | POA: Diagnosis not present

## 2024-06-11 DIAGNOSIS — F5112 Insufficient sleep syndrome: Secondary | ICD-10-CM | POA: Diagnosis not present

## 2024-06-11 DIAGNOSIS — G4733 Obstructive sleep apnea (adult) (pediatric): Secondary | ICD-10-CM | POA: Diagnosis not present

## 2024-06-21 ENCOUNTER — Other Ambulatory Visit: Payer: Self-pay | Admitting: Interventional Cardiology

## 2024-07-29 DIAGNOSIS — E118 Type 2 diabetes mellitus with unspecified complications: Secondary | ICD-10-CM | POA: Diagnosis not present

## 2024-07-29 DIAGNOSIS — Z01419 Encounter for gynecological examination (general) (routine) without abnormal findings: Secondary | ICD-10-CM | POA: Diagnosis not present

## 2024-07-29 DIAGNOSIS — R21 Rash and other nonspecific skin eruption: Secondary | ICD-10-CM | POA: Diagnosis not present

## 2024-12-12 ENCOUNTER — Other Ambulatory Visit: Payer: Self-pay | Admitting: Cardiology

## 2024-12-14 ENCOUNTER — Other Ambulatory Visit: Payer: Self-pay

## 2024-12-14 MED ORDER — ISOSORBIDE MONONITRATE ER 30 MG PO TB24: 30.0000 mg | ORAL_TABLET | Freq: Every day | ORAL | 0 refills | Status: DC

## 2024-12-14 MED ORDER — FUROSEMIDE 20 MG PO TABS: 20.0000 mg | ORAL_TABLET | Freq: Every day | ORAL | 0 refills | Status: DC | PRN

## 2025-01-09 ENCOUNTER — Other Ambulatory Visit: Payer: Self-pay | Admitting: Cardiology

## 2025-01-13 ENCOUNTER — Other Ambulatory Visit: Payer: Self-pay | Admitting: Cardiology

## 2025-01-18 NOTE — Telephone Encounter (Signed)
 Pt can get Labs at F/U appt

## 2025-01-21 ENCOUNTER — Other Ambulatory Visit: Payer: Self-pay | Admitting: Cardiology

## 2025-04-19 ENCOUNTER — Ambulatory Visit: Admitting: Cardiology
